# Patient Record
Sex: Female | Born: 1955 | Race: White | Hispanic: No | Marital: Married | Smoking: Never smoker
Health system: Southern US, Community
[De-identification: ages and names within clinical notes are randomized; demographics above are authoritative.]

## PROBLEM LIST (undated history)

## (undated) DIAGNOSIS — T8859XA Other complications of anesthesia, initial encounter: Secondary | ICD-10-CM

## (undated) DIAGNOSIS — K219 Gastro-esophageal reflux disease without esophagitis: Secondary | ICD-10-CM

## (undated) DIAGNOSIS — T4145XA Adverse effect of unspecified anesthetic, initial encounter: Secondary | ICD-10-CM

## (undated) DIAGNOSIS — M1990 Unspecified osteoarthritis, unspecified site: Secondary | ICD-10-CM

## (undated) HISTORY — PX: EYE SURGERY: SHX253

## (undated) HISTORY — PX: TONSILLECTOMY: SUR1361

---

## 1998-08-28 ENCOUNTER — Other Ambulatory Visit: Admission: RE | Admit: 1998-08-28 | Discharge: 1998-08-28 | Payer: Self-pay | Admitting: Obstetrics & Gynecology

## 2000-10-17 ENCOUNTER — Other Ambulatory Visit: Admission: RE | Admit: 2000-10-17 | Discharge: 2000-10-17 | Payer: Self-pay | Admitting: Obstetrics & Gynecology

## 2002-05-12 ENCOUNTER — Other Ambulatory Visit: Admission: RE | Admit: 2002-05-12 | Discharge: 2002-05-12 | Payer: Self-pay | Admitting: Obstetrics & Gynecology

## 2003-08-08 ENCOUNTER — Other Ambulatory Visit: Admission: RE | Admit: 2003-08-08 | Discharge: 2003-08-08 | Payer: Self-pay | Admitting: Obstetrics & Gynecology

## 2004-08-23 ENCOUNTER — Other Ambulatory Visit: Admission: RE | Admit: 2004-08-23 | Discharge: 2004-08-23 | Payer: Self-pay | Admitting: Obstetrics & Gynecology

## 2005-01-03 ENCOUNTER — Emergency Department (HOSPITAL_COMMUNITY): Admission: EM | Admit: 2005-01-03 | Discharge: 2005-01-03 | Payer: Self-pay | Admitting: Emergency Medicine

## 2011-05-23 ENCOUNTER — Other Ambulatory Visit: Payer: Self-pay | Admitting: Obstetrics & Gynecology

## 2011-05-23 DIAGNOSIS — R928 Other abnormal and inconclusive findings on diagnostic imaging of breast: Secondary | ICD-10-CM

## 2011-06-04 ENCOUNTER — Ambulatory Visit
Admission: RE | Admit: 2011-06-04 | Discharge: 2011-06-04 | Disposition: A | Discharge status: Home / Self Care | Payer: BC Managed Care – PPO | Source: Ambulatory Visit | Attending: Obstetrics & Gynecology | Admitting: Obstetrics & Gynecology

## 2011-06-04 DIAGNOSIS — R928 Other abnormal and inconclusive findings on diagnostic imaging of breast: Secondary | ICD-10-CM

## 2011-12-18 ENCOUNTER — Other Ambulatory Visit: Payer: Self-pay | Admitting: Obstetrics & Gynecology

## 2011-12-18 DIAGNOSIS — N6009 Solitary cyst of unspecified breast: Secondary | ICD-10-CM

## 2011-12-23 ENCOUNTER — Ambulatory Visit
Admission: RE | Admit: 2011-12-23 | Discharge: 2011-12-23 | Disposition: A | Discharge status: Home / Self Care | Payer: BC Managed Care – PPO | Source: Ambulatory Visit | Attending: Obstetrics & Gynecology | Admitting: Obstetrics & Gynecology

## 2011-12-23 DIAGNOSIS — N6009 Solitary cyst of unspecified breast: Secondary | ICD-10-CM

## 2013-06-07 ENCOUNTER — Other Ambulatory Visit: Payer: Self-pay

## 2013-06-07 DIAGNOSIS — Z1231 Encounter for screening mammogram for malignant neoplasm of breast: Secondary | ICD-10-CM

## 2013-06-11 ENCOUNTER — Ambulatory Visit
Admission: RE | Admit: 2013-06-11 | Discharge: 2013-06-11 | Disposition: A | Discharge status: Home / Self Care | Payer: BC Managed Care – PPO | Source: Ambulatory Visit

## 2013-06-11 DIAGNOSIS — Z1231 Encounter for screening mammogram for malignant neoplasm of breast: Secondary | ICD-10-CM

## 2016-05-28 ENCOUNTER — Ambulatory Visit (INDEPENDENT_AMBULATORY_CARE_PROVIDER_SITE_OTHER): Payer: Self-pay

## 2016-05-28 ENCOUNTER — Ambulatory Visit (INDEPENDENT_AMBULATORY_CARE_PROVIDER_SITE_OTHER): Payer: BC Managed Care – PPO | Admitting: Orthopedic Surgery

## 2016-05-28 ENCOUNTER — Encounter (INDEPENDENT_AMBULATORY_CARE_PROVIDER_SITE_OTHER): Payer: Self-pay

## 2016-05-28 DIAGNOSIS — M1612 Unilateral primary osteoarthritis, left hip: Secondary | ICD-10-CM | POA: Diagnosis not present

## 2016-05-28 DIAGNOSIS — M25552 Pain in left hip: Secondary | ICD-10-CM | POA: Diagnosis not present

## 2016-05-28 NOTE — Progress Notes (Signed)
   Office Visit Note   Patient: Sabrina Singleton           Date of Birth: 01-14-1956           MRN: [id number] Visit Date: 05/28/2016              Requested by: No referring provider defined for this encounter. PCP: Pcp Not In System   Assessment & Plan: Visit Diagnoses:  1. Unilateral primary osteoarthritis, left hip   2. Pain in left hip     Plan: Follow-up as needed. Discussed that with the hip arthritis she should continue her exercises and discussed that we could consider an intra-articular steroid injection with Dr. Alvester MorinNewton and also discussed long-term options with a total hip replacement. Patient states that her sister just had bilateral total hip replacements. Discussed that if she is ready to proceed with a total hip surgery we would refer her to Dr. Magnus IvanBlackman. Patient states she will hold on surgery at this time and continue with her therapy.  Follow-Up Instructions: Return if symptoms worsen or fail to improve.   Orders:  Orders Placed This Encounter  Procedures  . XR HIP UNILAT W OR W/O PELVIS 2-3 VIEWS LEFT   No orders of the defined types were placed in this encounter.     Procedures: No procedures performed   Clinical Data: No additional findings.   Subjective: Chief Complaint  Patient presents with  . Left Hip - Pain    Patient has a 10 year history of hip pain. 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 She states that physical therapy said she should have an MRI that there is "more than arthritis going on"    Review of Systems   Objective: Vital Signs: There were no vitals taken for this visit.  Physical Exam on examination patient is alert oriented no adenopathy well-dressed normal affect normal respiratory effort she does  have an antalgic gait with abductor lurch on the left which is mild. She has significant decreased range of motion the left hip compared to the right with internal rotation of about 10 external rotation of 30 on the left compared to internal rotation of 40 on the right and external rotation 40 on the right. She has no leg length inequality no shortening on the left compared to the right with her standing.  Ortho Exam  Specialty Comments:  No specialty comments available.  Imaging: Xr Hip Unilat W Or W/o Pelvis 2-3 Views Left  Result Date: 05/28/2016 2 view radiographs of the left hip shows arthritic changes with joint space narrowing and osteophytic bone spurring with no evidence of avascular necrosis no subcondylar cysts.    PMFS History: There are no active problems to display for this patient.  No past medical history on file.  No family history on file.  No past surgical history on file. Social History   Occupational History  . Not on file.   Social History Main Topics  . Smoking status: Not on file  . Smokeless tobacco: Not on file  . Alcohol use Not on file  . Drug use: Unknown  . Sexual activity: Not on file

## 2017-06-11 ENCOUNTER — Telehealth (INDEPENDENT_AMBULATORY_CARE_PROVIDER_SITE_OTHER): Payer: Self-pay | Admitting: Orthopedic Surgery

## 2017-06-11 NOTE — Telephone Encounter (Signed)
Patient states she & Dr Lajoyce Cornersuda had discussed her seeing one of the other physicians for hip replacement. She could not remember which one & wanted Dr Lajoyce Cornersuda to send the referral and give her a call

## 2017-06-11 NOTE — Telephone Encounter (Signed)
I called advised patient per Dr. Audrie Liauda's office notes she needs to be seeing Dr. Magnus IvanBlackman to discuss possible THA. Made appointment for next available with Dr. Magnus IvanBlackman at 06/26/17 at 345pm.

## 2017-06-26 ENCOUNTER — Encounter (INDEPENDENT_AMBULATORY_CARE_PROVIDER_SITE_OTHER): Payer: Self-pay | Admitting: Orthopaedic Surgery

## 2017-06-26 ENCOUNTER — Ambulatory Visit (INDEPENDENT_AMBULATORY_CARE_PROVIDER_SITE_OTHER): Payer: BC Managed Care – PPO | Admitting: Orthopaedic Surgery

## 2017-06-26 ENCOUNTER — Ambulatory Visit (INDEPENDENT_AMBULATORY_CARE_PROVIDER_SITE_OTHER): Payer: BC Managed Care – PPO

## 2017-06-26 DIAGNOSIS — M1612 Unilateral primary osteoarthritis, left hip: Secondary | ICD-10-CM | POA: Diagnosis not present

## 2017-06-26 DIAGNOSIS — M25552 Pain in left hip: Secondary | ICD-10-CM

## 2017-06-26 MED ORDER — TRAMADOL HCL 50 MG PO TABS: 50.0000 mg | ORAL_TABLET | Freq: Four times a day (QID) | ORAL | 0 refills | Status: AC | PRN

## 2017-06-26 MED ORDER — METHOCARBAMOL 500 MG PO TABS: 500.0000 mg | ORAL_TABLET | Freq: Four times a day (QID) | ORAL | 1 refills | Status: DC | PRN

## 2017-06-26 NOTE — Progress Notes (Signed)
Office Visit Note   Patient: Sabrina Singleton           Date of Birth: 03-21-1956           MRN: [id number] Visit Date: 06/26/2017              Requested by: No referring provider defined for this encounter. PCP: System, Pcp Not In   Assessment & Plan: Visit Diagnoses:  1. Pain in left hip   2. Unilateral primary osteoarthritis, left hip     Plan: At this point given the severity of her pain combined with severe arthritis on x-ray findings and given the failure of conservative treatment we are recommending a total hip arthroplasty through direct anterior approach.  I went over her hip model and explained in detail with the surgery involves including a thorough discussion the risk and benefits of surgery and what her intraoperative and postoperative course were involved.  All questions and concerns were answered and addressed.  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  We will work on getting this scheduled.  Follow-Up Instructions: Return for 2 weeks post-op.   Orders:  Orders Placed This Encounter  Procedures  . XR HIP UNILAT W OR W/O PELVIS 1V LEFT   Meds ordered this encounter  Medications  . methocarbamol (ROBAXIN) 500 MG tablet    Sig: Take 1 tablet (500 mg total) by mouth every 6 (six) hours as needed for muscle spasms.    Dispense:  60 tablet    Refill:  1  . traMADol (ULTRAM) 50 MG tablet    Sig: Take 1-2 tablets (50-100 mg total) by mouth every 6 (six) hours as needed.    Dispense:  60 tablet    Refill:  0      Procedures: No procedures performed   Clinical Data: No additional findings.   Subjective: Chief Complaint  Patient presents with  . 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  She first had x-rays in December 2017 work and x-ray her hip today as well.  Her pain is daily  and it hurts with pivoting activities.  It is 10 out of 10.  She is tried and failed all forms of conservative treatment at this point including injections and anti-inflammatories.  She actually has a family hip medical history of arthritis with a sister also is a Horticulturist, commercial who has had bilateral hip replacements.  She has trouble with stiffness in her left hip.  It is detrimentally affecting her activity living, quality of life, and her mobility.  Again conservative treatment has been done for over a year at this point.  She has trouble putting on her shoes and socks on that side as well as pivoting activities cause severe pain in the groin.  She states this is now affecting how she walks and causing back pain.  HPI  Review of Systems She denies any headache, chest pain, shortness of breath, fever, chills, nausea, vomiting.  Objective: Vital Signs: There were no vitals taken for this visit.  Physical Exam She is alert and oriented x3 and in no acute distress Ortho Exam Examination of the right hip is normal.  Examination of her left hip shows severe pain and stiffness with any attempts of internal or external rotation.  I did have her  lay supine and it felt like her leg lengths were near equal. Specialty Comments:  No specialty comments available.  Imaging: Xr Hip Unilat W Or W/o Pelvis 1v Left  Result Date: 06/26/2017 An AP pelvis and lateral of the left hip show severe end-stage arthritis of the left hip.  There is complete loss of the superior lateral joint space.  There is cystic changes in the femoral head and the acetabulum as well as particular osteophytes.  This is worsened when compared to films from December 2017.    PMFS History: Patient Active Problem List   Diagnosis Date Noted  . Pain in left hip 06/26/2017  . Unilateral primary osteoarthritis, left hip 06/26/2017   History reviewed. No pertinent past medical history.  History reviewed. No pertinent family history.  History  reviewed. No pertinent surgical history. Social History   Occupational History  . Not on file  Tobacco Use  . Smoking status: Not on file  Substance and Sexual Activity  . Alcohol use: Not on file  . Drug use: Not on file  . Sexual activity: Not on file

## 2017-09-26 ENCOUNTER — Other Ambulatory Visit (INDEPENDENT_AMBULATORY_CARE_PROVIDER_SITE_OTHER): Payer: Self-pay

## 2017-09-29 NOTE — Progress Notes (Signed)
Need orders in epic. preop on 4/18.

## 2017-09-30 ENCOUNTER — Other Ambulatory Visit (INDEPENDENT_AMBULATORY_CARE_PROVIDER_SITE_OTHER): Payer: Self-pay | Admitting: Physician Assistant

## 2017-09-30 NOTE — Patient Instructions (Addendum)
Sabrina Singleton  09/30/2017   Your procedure is scheduled on: 10-10-17  Report to [facility] Main  Entrance                 Report to admitting at       0530AM    Call this number if you have problems the morning of surgery [phone]    Remember: Do not eat food or drink liquids :After Midnight.     Take these medicines the morning of surgery with A SIP OF WATER: septra                                You may not have any metal on your body including hair pins and              piercings  Do not wear jewelry, make-up, lotions, powders or perfumes, deodorant             Do not wear nail polish.  Do not shave  48 hours prior to surgery.               Do not bring valuables to the hospital. [facility] IS NOT             RESPONSIBLE   FOR VALUABLES.  Contacts, dentures or bridgework may not be worn into surgery.  Leave suitcase in the car. After surgery it may be brought to your room.                 Please read over the following fact sheets you were given: _____________________________________________________________________          [facility] - Preparing for Surgery Before surgery, you can play an important role.  Because skin is not sterile, your skin needs to be as free of germs as possible.  You can reduce the number of germs on your skin by washing with CHG (chlorahexidine gluconate) soap before surgery.  CHG is an antiseptic cleaner which kills germs and bonds with the skin to continue killing germs even after washing. Please DO NOT use if you have an allergy to CHG or antibacterial soaps.  If your skin becomes reddened/irritated stop using the CHG and inform your nurse when you arrive at Short Stay. Do not shave (including legs and underarms) for at least 48 hours prior to the first CHG shower.  You may shave your face/neck. Please follow these instructions carefully:  1.  Shower with CHG Soap the night before surgery and the  morning of  Surgery.  2.  If you choose to wash your hair, wash your hair first as usual with your  normal  shampoo.  3.  After you shampoo, rinse your hair and body thoroughly to remove the  shampoo.                           4.  Use CHG as you would any other liquid soap.  You can apply chg directly  to the skin and wash                       Gently with a scrungie or clean washcloth.  5.  Apply the CHG Soap to your body ONLY FROM THE NECK DOWN.   Do not use on  face/ open                           Wound or open sores. Avoid contact with eyes, ears mouth and genitals (private parts).                       Wash face,  Genitals (private parts) with your normal soap.             6.  Wash thoroughly, paying special attention to the area where your surgery  will be performed.  7.  Thoroughly rinse your body with warm water from the neck down.  8.  DO NOT shower/wash with your normal soap after using and rinsing off  the CHG Soap.                9.  Pat yourself dry with a clean towel.            10.  Wear clean pajamas.            11.  Place clean sheets on your bed the night of your first shower and do not  sleep with pets. Day of Surgery : Do not apply any lotions/deodorants the morning of surgery.  Please wear clean clothes to the hospital/surgery center.  FAILURE TO FOLLOW THESE INSTRUCTIONS MAY RESULT IN THE CANCELLATION OF YOUR SURGERY PATIENT SIGNATURE_________________________________  NURSE SIGNATURE__________________________________  ________________________________________________________________________  WHAT IS A BLOOD TRANSFUSION? Blood Transfusion Information  A transfusion is the replacement of blood or some of its parts. Blood is made up of multiple cells which provide different functions.  Red blood cells carry oxygen and are used for blood loss replacement.  White blood cells fight against infection.  Platelets control bleeding.  Plasma helps clot blood.  Other blood products are  available for specialized needs, such as hemophilia or other clotting disorders. BEFORE THE TRANSFUSION  Who gives blood for transfusions?   Healthy volunteers who are fully evaluated to make sure their blood is safe. This is blood bank blood. Transfusion therapy is the safest it has ever been in the practice of medicine. Before blood is taken from a donor, a complete history is taken to make sure that person has no history of diseases nor engages in risky social behavior (examples are intravenous drug use or sexual activity with multiple partners). The donor's travel history is screened to minimize risk of transmitting infections, such as malaria. The donated blood is tested for signs of infectious diseases, such as HIV and hepatitis. The blood is then tested to be sure it is compatible with you in order to minimize the chance of a transfusion reaction. If you or a relative donates blood, this is often done in anticipation of surgery and is not appropriate for emergency situations. It takes many days to process the donated blood. RISKS AND COMPLICATIONS Although transfusion therapy is very safe and saves many lives, the main dangers of transfusion include:   Getting an infectious disease.  Developing a transfusion reaction. This is an allergic reaction to something in the blood you were given. Every precaution is taken to prevent this. The decision to have a blood transfusion has been considered carefully by your caregiver before blood is given. Blood is not given unless the benefits outweigh the risks. AFTER THE TRANSFUSION  Right after receiving a blood transfusion, you will usually feel much better and more energetic. This is especially true if your red  blood cells have gotten low (anemic). The transfusion raises the level of the red blood cells which carry oxygen, and this usually causes an energy increase.  The nurse administering the transfusion will monitor you carefully for  complications. HOME CARE INSTRUCTIONS  No special instructions are needed after a transfusion. You may find your energy is better. Speak with your caregiver about any limitations on activity for underlying diseases you may have. SEEK MEDICAL CARE IF:   Your condition is not improving after your transfusion.  You develop redness or irritation at the intravenous (IV) site. SEEK IMMEDIATE MEDICAL CARE IF:  Any of the following symptoms occur over the next 12 hours:  Shaking chills.  You have a temperature by mouth above 102 F (38.9 C), not controlled by medicine.  Chest, back, or muscle pain.  People around you feel you are not acting correctly or are confused.  Shortness of breath or difficulty breathing.  Dizziness and fainting.  You get a rash or develop hives.  You have a decrease in urine output.  Your urine turns a dark color or changes to pink, red, or brown. Any of the following symptoms occur over the next 10 days:  You have a temperature by mouth above 102 F (38.9 C), not controlled by medicine.  Shortness of breath.  Weakness after normal activity.  The white part of the eye turns yellow (jaundice).  You have a decrease in the amount of urine or are urinating less often.  Your urine turns a dark color or changes to pink, red, or brown. Document Released: 05/31/2000 Document Revised: 08/26/2011 Document Reviewed: 01/18/2008 Exit[employer] Patient Information 2014 [place], [place].  _______________________________________________________________________  Incentive Spirometer  An incentive spirometer is a tool that can help keep your lungs clear and active. This tool measures how well you are filling your lungs with each breath. Taking long deep breaths may help reverse or decrease the chance of developing breathing (pulmonary) problems (especially infection) following:  A long period of time when you are unable to move or be active. BEFORE THE PROCEDURE   If  the spirometer includes an indicator to show your best effort, your nurse or respiratory therapist will set it to a desired goal.  If possible, sit up straight or lean slightly forward. Try not to slouch.  Hold the incentive spirometer in an upright position. INSTRUCTIONS FOR USE  1. Sit on the edge of your bed if possible, or sit up as far as you can in bed or on a chair. 2. Hold the incentive spirometer in an upright position. 3. Breathe out normally. 4. Place the mouthpiece in your mouth and seal your lips tightly around it. 5. Breathe in slowly and as deeply as possible, raising the piston or the ball toward the top of the column. 6. Hold your breath for 3-5 seconds or for as long as possible. Allow the piston or ball to fall to the bottom of the column. 7. Remove the mouthpiece from your mouth and breathe out normally. 8. Rest for a few seconds and repeat Steps 1 through 7 at least 10 times every 1-2 hours when you are awake. Take your time and take a few normal breaths between deep breaths. 9. The spirometer may include an indicator to show your best effort. Use the indicator as a goal to work toward during each repetition. 10. After each set of 10 deep breaths, practice coughing to be sure your lungs are clear. If you have an incision (the cut  made at the time of surgery), support your incision when coughing by placing a pillow or rolled up towels firmly against it. Once you are able to get out of bed, walk around indoors and cough well. You may stop using the incentive spirometer when instructed by your caregiver.  RISKS AND COMPLICATIONS  Take your time so you do not get dizzy or light-headed.  If you are in pain, you may need to take or ask for pain medication before doing incentive spirometry. It is harder to take a deep breath if you are having pain. AFTER USE  Rest and breathe slowly and easily.  It can be helpful to keep track of a log of your progress. Your caregiver can  provide you with a simple table to help with this. If you are using the spirometer at home, follow these instructions: SEEK MEDICAL CARE IF:   You are having difficultly using the spirometer.  You have trouble using the spirometer as often as instructed.  Your pain medication is not giving enough relief while using the spirometer.  You develop fever of 100.5 F (38.1 C) or higher. SEEK IMMEDIATE MEDICAL CARE IF:   You cough up bloody sputum that had not been present before.  You develop fever of 102 F (38.9 C) or greater.  You develop worsening pain at or near the incision site. MAKE SURE YOU:   Understand these instructions.  Will watch your condition.  Will get help right away if you are not doing well or get worse. Document Released: 10/14/2006 Document Revised: 08/26/2011 Document Reviewed: 12/15/2006 [facility] Patient Information 2014 [place], [place].   ________________________________________________________________________

## 2017-10-02 ENCOUNTER — Encounter (HOSPITAL_COMMUNITY): Payer: Self-pay

## 2017-10-02 ENCOUNTER — Encounter (HOSPITAL_COMMUNITY)
Admission: RE | Admit: 2017-10-02 | Discharge: 2017-10-02 | Disposition: A | Discharge status: Home / Self Care | Payer: BC Managed Care – PPO | Source: Ambulatory Visit | Attending: Orthopaedic Surgery | Admitting: Orthopaedic Surgery

## 2017-10-02 ENCOUNTER — Other Ambulatory Visit: Payer: Self-pay

## 2017-10-02 DIAGNOSIS — Z01810 Encounter for preprocedural cardiovascular examination: Secondary | ICD-10-CM | POA: Insufficient documentation

## 2017-10-02 DIAGNOSIS — M1612 Unilateral primary osteoarthritis, left hip: Secondary | ICD-10-CM | POA: Insufficient documentation

## 2017-10-02 DIAGNOSIS — Z01812 Encounter for preprocedural laboratory examination: Secondary | ICD-10-CM | POA: Diagnosis present

## 2017-10-02 HISTORY — DX: Other complications of anesthesia, initial encounter: T88.59XA

## 2017-10-02 HISTORY — DX: Adverse effect of unspecified anesthetic, initial encounter: T41.45XA

## 2017-10-02 HISTORY — DX: Gastro-esophageal reflux disease without esophagitis: K21.9

## 2017-10-02 HISTORY — DX: Unspecified osteoarthritis, unspecified site: M19.90

## 2017-10-02 LAB — CBC
HCT: 36.1 % (ref 36.0–46.0)
HEMOGLOBIN: 12 g/dL (ref 12.0–15.0)
MCH: 30.5 pg (ref 26.0–34.0)
MCHC: 33.2 g/dL (ref 30.0–36.0)
MCV: 91.9 fL (ref 78.0–100.0)
PLATELETS: 264 10*3/uL (ref 150–400)
RBC: 3.93 MIL/uL (ref 3.87–5.11)
RDW: 13.2 % (ref 11.5–15.5)
WBC: 3.7 10*3/uL — AB (ref 4.0–10.5)

## 2017-10-02 LAB — ABO/RH: ABO/RH(D): A POS

## 2017-10-02 LAB — SURGICAL PCR SCREEN
MRSA, PCR: NEGATIVE
Staphylococcus aureus: NEGATIVE

## 2017-10-09 ENCOUNTER — Encounter (HOSPITAL_COMMUNITY): Payer: Self-pay | Admitting: Anesthesiology

## 2017-10-09 MED ORDER — TRANEXAMIC ACID 1000 MG/10ML IV SOLN
1000.0000 mg | INTRAVENOUS | Status: AC
  Administered 2017-10-10: 1000 mg via INTRAVENOUS
  Filled 2017-10-09: qty 1100

## 2017-10-09 NOTE — Anesthesia Preprocedure Evaluation (Addendum)
Anesthesia Evaluation  Patient identified by MRN, date of birth, ID band Patient awake    Reviewed: Allergy & Precautions, NPO status , Patient's Chart, lab work & pertinent test results  History of Anesthesia Complications (+) history of anesthetic complications  Airway Mallampati: II  TM Distance: >3 FB Neck ROM: Full    Dental no notable dental hx. (+) Teeth Intact   Pulmonary neg pulmonary ROS,    Pulmonary exam normal breath sounds clear to auscultation       Cardiovascular Normal cardiovascular exam Rhythm:Regular Rate:Normal     Neuro/Psych negative neurological ROS  negative psych ROS   GI/Hepatic Neg liver ROS, GERD  ,  Endo/Other  negative endocrine ROS  Renal/GU negative Renal ROS  negative genitourinary   Musculoskeletal  (+) Arthritis , Osteoarthritis,  OA left hip   Abdominal   Peds  Hematology negative hematology ROS (+)   Anesthesia Other Findings   Reproductive/Obstetrics                            Anesthesia Physical Anesthesia Plan  ASA: II  Anesthesia Plan: Spinal   Post-op Pain Management:    Induction:   PONV Risk Score and Plan: 3 and Treatment may vary due to age or medical condition, Ondansetron, Dexamethasone, Midazolam and Scopolamine patch - Pre-op  Airway Management Planned: Natural Airway, Nasal Cannula and Simple Face Mask  Additional Equipment:   Intra-op Plan:   Post-operative Plan: Extubation in OR  Informed Consent: I have reviewed the patients History and Physical, chart, labs and discussed the procedure including the risks, benefits and alternatives for the proposed anesthesia with the patient or authorized representative who has indicated his/her understanding and acceptance.   Dental advisory given  Plan Discussed with: CRNA, Anesthesiologist and Surgeon  Anesthesia Plan Comments:        Anesthesia Quick Evaluation

## 2017-10-10 ENCOUNTER — Inpatient Hospital Stay (HOSPITAL_COMMUNITY): Payer: BC Managed Care – PPO | Admitting: Certified Registered Nurse Anesthetist

## 2017-10-10 ENCOUNTER — Inpatient Hospital Stay (HOSPITAL_COMMUNITY): Payer: BC Managed Care – PPO

## 2017-10-10 ENCOUNTER — Inpatient Hospital Stay (HOSPITAL_COMMUNITY)
Admission: RE | Admit: 2017-10-10 | Discharge: 2017-10-12 | DRG: 470 | Disposition: A | Discharge status: Home Health | Payer: BC Managed Care – PPO | Source: Ambulatory Visit | Attending: Orthopaedic Surgery | Admitting: Orthopaedic Surgery

## 2017-10-10 ENCOUNTER — Encounter (HOSPITAL_COMMUNITY)
Admission: RE | Disposition: A | Discharge status: Home Health | Payer: Self-pay | Source: Ambulatory Visit | Attending: Orthopaedic Surgery

## 2017-10-10 ENCOUNTER — Other Ambulatory Visit: Payer: Self-pay

## 2017-10-10 ENCOUNTER — Encounter (HOSPITAL_COMMUNITY): Payer: Self-pay | Admitting: *Deleted

## 2017-10-10 DIAGNOSIS — Z885 Allergy status to narcotic agent status: Secondary | ICD-10-CM | POA: Diagnosis not present

## 2017-10-10 DIAGNOSIS — Z419 Encounter for procedure for purposes other than remedying health state, unspecified: Secondary | ICD-10-CM

## 2017-10-10 DIAGNOSIS — Z96642 Presence of left artificial hip joint: Secondary | ICD-10-CM

## 2017-10-10 DIAGNOSIS — M1612 Unilateral primary osteoarthritis, left hip: Principal | ICD-10-CM | POA: Diagnosis present

## 2017-10-10 HISTORY — PX: TOTAL HIP ARTHROPLASTY: SHX124

## 2017-10-10 LAB — TYPE AND SCREEN
ABO/RH(D): A POS
Antibody Screen: NEGATIVE

## 2017-10-10 SURGERY — ARTHROPLASTY, HIP, TOTAL, ANTERIOR APPROACH
Anesthesia: Spinal | Site: Hip | Laterality: Left

## 2017-10-10 MED ORDER — OXYCODONE HCL 5 MG PO TABS
10.0000 mg | ORAL_TABLET | ORAL | Status: DC | PRN
  Administered 2017-10-11 – 2017-10-12 (×2): 10 mg via ORAL
  Filled 2017-10-10: qty 2

## 2017-10-10 MED ORDER — SCOPOLAMINE 1 MG/3DAYS TD PT72
MEDICATED_PATCH | TRANSDERMAL | Status: DC | PRN
  Administered 2017-10-10: 1 via TRANSDERMAL

## 2017-10-10 MED ORDER — METOCLOPRAMIDE HCL 5 MG/ML IJ SOLN: 10.0000 mg | Freq: Once | INTRAMUSCULAR | Status: DC | PRN

## 2017-10-10 MED ORDER — ONDANSETRON HCL 4 MG/2ML IJ SOLN
INTRAMUSCULAR | Status: AC
  Filled 2017-10-10: qty 2

## 2017-10-10 MED ORDER — ACETAMINOPHEN 325 MG PO TABS
325.0000 mg | ORAL_TABLET | Freq: Four times a day (QID) | ORAL | Status: DC | PRN
  Administered 2017-10-11 (×2): 650 mg via ORAL
  Filled 2017-10-10 (×2): qty 2

## 2017-10-10 MED ORDER — LACTATED RINGERS IV SOLN
INTRAVENOUS | Status: DC
  Administered 2017-10-10 (×3): via INTRAVENOUS

## 2017-10-10 MED ORDER — ASPIRIN 81 MG PO CHEW
81.0000 mg | CHEWABLE_TABLET | Freq: Two times a day (BID) | ORAL | Status: DC
  Administered 2017-10-10 – 2017-10-12 (×4): 81 mg via ORAL
  Filled 2017-10-10 (×4): qty 1

## 2017-10-10 MED ORDER — ONDANSETRON HCL 4 MG/2ML IJ SOLN: 4.0000 mg | Freq: Four times a day (QID) | INTRAMUSCULAR | Status: DC | PRN

## 2017-10-10 MED ORDER — SODIUM CHLORIDE 0.9 % IV SOLN
INTRAVENOUS | Status: DC
  Administered 2017-10-10: 11:00:00 via INTRAVENOUS

## 2017-10-10 MED ORDER — ONDANSETRON HCL 4 MG/2ML IJ SOLN
INTRAMUSCULAR | Status: DC | PRN
  Administered 2017-10-10: 4 mg via INTRAVENOUS

## 2017-10-10 MED ORDER — METHOCARBAMOL 1000 MG/10ML IJ SOLN
500.0000 mg | Freq: Four times a day (QID) | INTRAVENOUS | Status: DC | PRN
  Administered 2017-10-10: 500 mg via INTRAVENOUS
  Filled 2017-10-10: qty 550

## 2017-10-10 MED ORDER — DEXAMETHASONE SODIUM PHOSPHATE 10 MG/ML IJ SOLN
INTRAMUSCULAR | Status: AC
  Filled 2017-10-10: qty 1

## 2017-10-10 MED ORDER — FENTANYL CITRATE (PF) 100 MCG/2ML IJ SOLN
INTRAMUSCULAR | Status: AC
  Filled 2017-10-10: qty 2

## 2017-10-10 MED ORDER — ONDANSETRON HCL 4 MG PO TABS
4.0000 mg | ORAL_TABLET | Freq: Four times a day (QID) | ORAL | Status: DC | PRN
  Administered 2017-10-10: 4 mg via ORAL
  Filled 2017-10-10: qty 1

## 2017-10-10 MED ORDER — DOCUSATE SODIUM 100 MG PO CAPS
100.0000 mg | ORAL_CAPSULE | Freq: Two times a day (BID) | ORAL | Status: DC
  Administered 2017-10-10 – 2017-10-12 (×5): 100 mg via ORAL
  Filled 2017-10-10 (×5): qty 1

## 2017-10-10 MED ORDER — HYDROMORPHONE HCL 1 MG/ML IJ SOLN: 0.2500 mg | INTRAMUSCULAR | Status: DC | PRN

## 2017-10-10 MED ORDER — SODIUM CHLORIDE 0.9 % IR SOLN
Status: DC | PRN
  Administered 2017-10-10: 1

## 2017-10-10 MED ORDER — METOCLOPRAMIDE HCL 5 MG/ML IJ SOLN: 5.0000 mg | Freq: Three times a day (TID) | INTRAMUSCULAR | Status: DC | PRN

## 2017-10-10 MED ORDER — SCOPOLAMINE 1 MG/3DAYS TD PT72
MEDICATED_PATCH | TRANSDERMAL | Status: AC
  Filled 2017-10-10: qty 1

## 2017-10-10 MED ORDER — FENTANYL CITRATE (PF) 100 MCG/2ML IJ SOLN
INTRAMUSCULAR | Status: DC | PRN
  Administered 2017-10-10 (×2): 50 ug via INTRAVENOUS

## 2017-10-10 MED ORDER — PHENOL 1.4 % MT LIQD: 1.0000 | OROMUCOSAL | Status: DC | PRN

## 2017-10-10 MED ORDER — METHOCARBAMOL 500 MG PO TABS
500.0000 mg | ORAL_TABLET | Freq: Four times a day (QID) | ORAL | Status: DC | PRN
  Administered 2017-10-10 – 2017-10-12 (×6): 500 mg via ORAL
  Filled 2017-10-10 (×6): qty 1

## 2017-10-10 MED ORDER — OXYCODONE HCL 5 MG PO TABS
5.0000 mg | ORAL_TABLET | ORAL | Status: DC | PRN
  Administered 2017-10-10 (×2): 10 mg via ORAL
  Administered 2017-10-10 – 2017-10-11 (×5): 5 mg via ORAL
  Administered 2017-10-12: 10 mg via ORAL
  Filled 2017-10-10 (×2): qty 2
  Filled 2017-10-10 (×4): qty 1
  Filled 2017-10-10: qty 2
  Filled 2017-10-10 (×3): qty 1
  Filled 2017-10-10: qty 2

## 2017-10-10 MED ORDER — PHENYLEPHRINE 40 MCG/ML (10ML) SYRINGE FOR IV PUSH (FOR BLOOD PRESSURE SUPPORT)
PREFILLED_SYRINGE | INTRAVENOUS | Status: AC
  Filled 2017-10-10: qty 10

## 2017-10-10 MED ORDER — PROPOFOL 10 MG/ML IV BOLUS
INTRAVENOUS | Status: DC | PRN
  Administered 2017-10-10 (×2): 10 mg via INTRAVENOUS
  Administered 2017-10-10: 20 mg via INTRAVENOUS
  Administered 2017-10-10 (×2): 10 mg via INTRAVENOUS
  Administered 2017-10-10: 20 mg via INTRAVENOUS

## 2017-10-10 MED ORDER — MENTHOL 3 MG MT LOZG: 1.0000 | LOZENGE | OROMUCOSAL | Status: DC | PRN

## 2017-10-10 MED ORDER — DIPHENHYDRAMINE HCL 12.5 MG/5ML PO ELIX: 12.5000 mg | ORAL_SOLUTION | ORAL | Status: DC | PRN

## 2017-10-10 MED ORDER — MIDAZOLAM HCL 2 MG/2ML IJ SOLN
INTRAMUSCULAR | Status: AC
  Filled 2017-10-10: qty 2

## 2017-10-10 MED ORDER — PANTOPRAZOLE SODIUM 40 MG PO TBEC
40.0000 mg | DELAYED_RELEASE_TABLET | Freq: Every day | ORAL | Status: DC
  Administered 2017-10-10 – 2017-10-12 (×3): 40 mg via ORAL
  Filled 2017-10-10 (×3): qty 1

## 2017-10-10 MED ORDER — TRAMADOL HCL 50 MG PO TABS
100.0000 mg | ORAL_TABLET | Freq: Four times a day (QID) | ORAL | Status: DC | PRN
  Administered 2017-10-11: 100 mg via ORAL
  Filled 2017-10-10 (×2): qty 2

## 2017-10-10 MED ORDER — POLYETHYLENE GLYCOL 3350 17 G PO PACK: 17.0000 g | PACK | Freq: Every day | ORAL | Status: DC | PRN

## 2017-10-10 MED ORDER — METOCLOPRAMIDE HCL 5 MG PO TABS: 5.0000 mg | ORAL_TABLET | Freq: Three times a day (TID) | ORAL | Status: DC | PRN

## 2017-10-10 MED ORDER — PROPOFOL 500 MG/50ML IV EMUL
INTRAVENOUS | Status: DC | PRN
  Administered 2017-10-10: 75 ug/kg/min via INTRAVENOUS

## 2017-10-10 MED ORDER — CHLORHEXIDINE GLUCONATE 4 % EX LIQD: 60.0000 mL | Freq: Once | CUTANEOUS | Status: DC

## 2017-10-10 MED ORDER — DEXAMETHASONE SODIUM PHOSPHATE 10 MG/ML IJ SOLN
INTRAMUSCULAR | Status: DC | PRN
  Administered 2017-10-10: 10 mg via INTRAVENOUS

## 2017-10-10 MED ORDER — HYDROMORPHONE HCL 1 MG/ML IJ SOLN: 0.5000 mg | INTRAMUSCULAR | Status: DC | PRN

## 2017-10-10 MED ORDER — BUPIVACAINE IN DEXTROSE 0.75-8.25 % IT SOLN
INTRATHECAL | Status: DC | PRN
  Administered 2017-10-10: 1.8 mL via INTRATHECAL

## 2017-10-10 MED ORDER — MEPERIDINE HCL 50 MG/ML IJ SOLN: 6.2500 mg | INTRAMUSCULAR | Status: DC | PRN

## 2017-10-10 MED ORDER — MIDAZOLAM HCL 5 MG/5ML IJ SOLN
INTRAMUSCULAR | Status: DC | PRN
  Administered 2017-10-10: 2 mg via INTRAVENOUS

## 2017-10-10 MED ORDER — ALUM & MAG HYDROXIDE-SIMETH 200-200-20 MG/5ML PO SUSP: 30.0000 mL | ORAL | Status: DC | PRN

## 2017-10-10 MED ORDER — EPHEDRINE 5 MG/ML INJ
INTRAVENOUS | Status: AC
  Filled 2017-10-10: qty 10

## 2017-10-10 MED ORDER — CEFAZOLIN SODIUM-DEXTROSE 1-4 GM/50ML-% IV SOLN
1.0000 g | Freq: Four times a day (QID) | INTRAVENOUS | Status: AC
  Administered 2017-10-10 (×2): 1 g via INTRAVENOUS
  Filled 2017-10-10 (×2): qty 50

## 2017-10-10 MED ORDER — PHENYLEPHRINE 40 MCG/ML (10ML) SYRINGE FOR IV PUSH (FOR BLOOD PRESSURE SUPPORT)
PREFILLED_SYRINGE | INTRAVENOUS | Status: DC | PRN
  Administered 2017-10-10: 80 ug via INTRAVENOUS
  Administered 2017-10-10: 40 ug via INTRAVENOUS
  Administered 2017-10-10: 80 ug via INTRAVENOUS
  Administered 2017-10-10 (×3): 40 ug via INTRAVENOUS
  Administered 2017-10-10: 80 ug via INTRAVENOUS

## 2017-10-10 MED ORDER — PROPOFOL 10 MG/ML IV BOLUS
INTRAVENOUS | Status: AC
  Filled 2017-10-10: qty 60

## 2017-10-10 MED ORDER — EPHEDRINE SULFATE-NACL 50-0.9 MG/10ML-% IV SOSY
PREFILLED_SYRINGE | INTRAVENOUS | Status: DC | PRN
  Administered 2017-10-10 (×2): 5 mg via INTRAVENOUS
  Administered 2017-10-10: 10 mg via INTRAVENOUS

## 2017-10-10 MED ORDER — CEFAZOLIN SODIUM-DEXTROSE 2-4 GM/100ML-% IV SOLN
2.0000 g | INTRAVENOUS | Status: AC
  Administered 2017-10-10: 2 g via INTRAVENOUS
  Filled 2017-10-10: qty 100

## 2017-10-10 SURGICAL SUPPLY — 35 items
BAG ZIPLOCK 12X15 (MISCELLANEOUS) IMPLANT
BENZOIN TINCTURE PRP APPL 2/3 (GAUZE/BANDAGES/DRESSINGS) IMPLANT
BLADE SAW SGTL 18X1.27X75 (BLADE) ×2 IMPLANT
BLADE SAW SGTL 18X1.27X75MM (BLADE) ×1
CAPT HIP TOTAL 2 ×3 IMPLANT
CLOSURE WOUND 1/2 X4 (GAUZE/BANDAGES/DRESSINGS) ×1
COVER PERINEAL POST (MISCELLANEOUS) ×3 IMPLANT
COVER SURGICAL LIGHT HANDLE (MISCELLANEOUS) ×3 IMPLANT
DRAPE STERI IOBAN 125X83 (DRAPES) ×3 IMPLANT
DRAPE U-SHAPE 47X51 STRL (DRAPES) ×6 IMPLANT
DRESSING AQUACEL AG SP 3.5X10 (GAUZE/BANDAGES/DRESSINGS) ×1 IMPLANT
DRSG AQUACEL AG ADV 3.5X10 (GAUZE/BANDAGES/DRESSINGS) ×3 IMPLANT
DRSG AQUACEL AG SP 3.5X10 (GAUZE/BANDAGES/DRESSINGS) ×3
DURAPREP 26ML APPLICATOR (WOUND CARE) ×3 IMPLANT
ELECT REM PT RETURN 15FT ADLT (MISCELLANEOUS) ×3 IMPLANT
GAUZE XEROFORM 1X8 LF (GAUZE/BANDAGES/DRESSINGS) ×6 IMPLANT
GLOVE BIO SURGEON STRL SZ7.5 (GLOVE) ×3 IMPLANT
GLOVE BIOGEL PI IND STRL 8 (GLOVE) ×2 IMPLANT
GLOVE BIOGEL PI INDICATOR 8 (GLOVE) ×4
GLOVE ECLIPSE 8.0 STRL XLNG CF (GLOVE) ×3 IMPLANT
GOWN STRL REUS W/TWL XL LVL3 (GOWN DISPOSABLE) ×6 IMPLANT
HANDPIECE INTERPULSE COAX TIP (DISPOSABLE) ×2
HOLDER FOLEY CATH W/STRAP (MISCELLANEOUS) ×3 IMPLANT
PACK ANTERIOR HIP CUSTOM (KITS) ×3 IMPLANT
SET HNDPC FAN SPRY TIP SCT (DISPOSABLE) ×1 IMPLANT
STAPLER VISISTAT 35W (STAPLE) IMPLANT
STRIP CLOSURE SKIN 1/2X4 (GAUZE/BANDAGES/DRESSINGS) ×2 IMPLANT
SUT ETHIBOND NAB CT1 #1 30IN (SUTURE) ×3 IMPLANT
SUT MNCRL AB 4-0 PS2 18 (SUTURE) IMPLANT
SUT VIC AB 0 CT1 36 (SUTURE) ×3 IMPLANT
SUT VIC AB 1 CT1 36 (SUTURE) ×3 IMPLANT
SUT VIC AB 2-0 CT1 27 (SUTURE) ×4
SUT VIC AB 2-0 CT1 TAPERPNT 27 (SUTURE) ×2 IMPLANT
TRAY FOLEY W/METER SILVER 16FR (SET/KITS/TRAYS/PACK) ×3 IMPLANT
YANKAUER SUCT BULB TIP 10FT TU (MISCELLANEOUS) ×3 IMPLANT

## 2017-10-10 NOTE — Brief Op Note (Signed)
10/10/2017  8:36 AM  PATIENT:  Ivonne Andreweresa H Hailes  62 y.o. female  PRE-OPERATIVE DIAGNOSIS:  osteoarthritis left hip  POST-OPERATIVE DIAGNOSIS:  osteoarthritis left hip  PROCEDURE:  Procedure(s): LEFT TOTAL HIP ARTHROPLASTY ANTERIOR APPROACH (Left)  SURGEON:  Surgeon(s) and Role:    Kathryne Hitch* Elmar Antigua Y, MD - Primary  PHYSICIAN ASSISTANT: Rexene EdisonGil Clark, PA-C assisted  ANESTHESIA:   spinal  EBL:  200 mL   COUNTS:  YES  DICTATION: .Other Dictation: Dictation Number [id number]  PLAN OF CARE: Admit to inpatient   PATIENT DISPOSITION:  PACU - hemodynamically stable.   Delay start of Pharmacological VTE agent (>24hrs) due to surgical blood loss or risk of bleeding: no

## 2017-10-10 NOTE — Anesthesia Procedure Notes (Signed)
Spinal  Patient location during procedure: OR Start time: 10/10/2017 7:20 AM End time: 10/10/2017 7:23 AM Staffing Anesthesiologist: Mal AmabileFoster, Michael, MD Resident/CRNA: Epimenio SarinJarvela, Laquon Emel R, CRNA Performed: resident/CRNA  Preanesthetic Checklist Completed: patient identified, surgical consent, pre-op evaluation, timeout performed, IV checked, risks and benefits discussed and monitors and equipment checked Spinal Block Patient position: sitting Prep: ChloraPrep Patient monitoring: heart rate, cardiac monitor, continuous pulse ox and blood pressure Approach: midline Location: L3-4 Injection technique: single-shot Needle Needle type: Pencan  Needle gauge: 24 G Needle length: 10 cm Needle insertion depth: 7 cm Assessment Sensory level: T6

## 2017-10-10 NOTE — Transfer of Care (Signed)
Immediate Anesthesia Transfer of Care Note  Patient: Sabrina Singleton  Procedure(s) Performed: LEFT TOTAL HIP ARTHROPLASTY ANTERIOR APPROACH (Left Hip)  Patient Location: PACU  Anesthesia Type:Spinal  Level of Consciousness: drowsy and patient cooperative  Airway & Oxygen Therapy: Patient Spontanous Breathing and Patient connected to face mask oxygen  Post-op Assessment: Report given to RN and Post -op Vital signs reviewed and stable  Post vital signs: Reviewed and stable  Last Vitals:  Vitals Value Taken Time  BP 112/61 10/10/2017  8:51 AM  Temp    Pulse 102 10/10/2017  8:54 AM  Resp 9 10/10/2017  8:54 AM  SpO2 100 % 10/10/2017  8:54 AM  Vitals shown include unvalidated device data.  Last Pain:  Vitals:   10/10/17 0615  TempSrc:   PainSc: 1       Patients Stated Pain Goal: 4 (10/10/17 0615)  Complications: No apparent anesthesia complications

## 2017-10-10 NOTE — Evaluation (Signed)
Physical Therapy Evaluation Patient Details Name: Sabrina Singleton MRN: [id number] DOB: 08/25/1955 Today's Date: 10/10/2017   History of Present Illness  Pt s/p L THR   Clinical Impression  Pt s/p L THR and presents with decreased L LE strength/ROM and post op pain limiting functional mobility.  Pt should progress well to dc home with family assist.    Follow Up Recommendations Home health PT    Equipment Recommendations  None recommended by PT    Recommendations for Other Services OT consult     Precautions / Restrictions Precautions Precautions: Fall Restrictions Weight Bearing Restrictions: No Other Position/Activity Restrictions: WBAT      Mobility  Bed Mobility Overal bed mobility: Needs Assistance Bed Mobility: Supine to Sit     Supine to sit: Min assist     General bed mobility comments: cues for sequence and use of R LE to self assist  Transfers Overall transfer level: Needs assistance   Transfers: Sit to/from Stand Sit to Stand: Min assist         General transfer comment: cues for LE management and use of UEs to self assist  Ambulation/Gait Ambulation/Gait assistance: Min assist Ambulation Distance (Feet): 10 Feet Assistive device: Rolling walker (2 wheeled) Gait Pattern/deviations: Step-to pattern;Decreased step length - right;Decreased step length - left;Shuffle;Trunk flexed Gait velocity: decr   General Gait Details: cues for posture, position from RW and sequence  Stairs            Wheelchair Mobility    Modified Rankin (Stroke Patients Only)       Balance                                             Pertinent Vitals/Pain Pain Assessment: 0-10 Pain Score: 5  Pain Location: L hip Pain Descriptors / Indicators: Aching;Sore Pain Intervention(s): Limited activity within patient's tolerance;Monitored during session;Premedicated before session;Ice applied    Home Living Family/patient expects to be discharged  to:: Private residence Living Arrangements: Spouse/significant other Available Help at Discharge: Family Type of Home: House Home Access: Stairs to enter Entrance Stairs-Rails: Lawyer of Steps: 4+1 Home Layout: Two level;Able to live on main level with bedroom/bathroom Home Equipment: Dan Humphreys - 2 wheels;Cane - single point      Prior Function Level of Independence: Independent               Hand Dominance        Extremity/Trunk Assessment   Upper Extremity Assessment Upper Extremity Assessment: Overall WFL for tasks assessed    Lower Extremity Assessment Lower Extremity Assessment: LLE deficits/detail LLE Deficits / Details: Strength at hip 2/5 with AAROM to 75 flex and 15 abd    Cervical / Trunk Assessment Cervical / Trunk Assessment: Normal  Communication   Communication: No difficulties  Cognition Arousal/Alertness: Awake/alert Behavior During Therapy: WFL for tasks assessed/performed Overall Cognitive Status: Within Functional Limits for tasks assessed                                        General Comments      Exercises Total Joint Exercises Ankle Circles/Pumps: AROM;Both;15 reps;Supine Heel Slides: Both;AROM;15 reps;Supine Hip ABduction/ADduction: AAROM;Left;Supine;10 reps   Assessment/Plan    PT Assessment Patient needs continued PT services  PT Problem List Decreased  strength;Decreased range of motion;Decreased activity tolerance;Decreased mobility;Decreased knowledge of use of DME;Pain       PT Treatment Interventions DME instruction;Gait training;Stair training;Functional mobility training;Therapeutic activities;Therapeutic exercise;Patient/family education    PT Goals (Current goals can be found in the Care Plan section)  Acute Rehab PT Goals Patient Stated Goal: Regain IND PT Goal Formulation: With patient Time For Goal Achievement: 10/17/17 Potential to Achieve Goals: Good    Frequency  7X/week   Barriers to discharge        Co-evaluation               AM-PAC PT "6 Clicks" Daily Activity  Outcome Measure Difficulty turning over in bed (including adjusting bedclothes, sheets and blankets)?: Unable   Difficulty sitting down on and standing up from a chair with arms (e.g., wheelchair, bedside commode, etc,.)?: Unable Help needed moving to and from a bed to chair (including a wheelchair)?: A Little Help needed walking in hospital room?: A Lot Help needed climbing 3-5 steps with a railing? : A Lot 6 Click Score: 9    End of Session Equipment Utilized During Treatment: Gait belt Activity Tolerance: Patient tolerated treatment well Patient left: in chair;with call bell/phone within reach;with family/visitor present Nurse Communication: Mobility status PT Visit Diagnosis: Difficulty in walking, not elsewhere classified (R26.2)    Time: 1429-1500 PT Time Calculation (min) (ACUTE ONLY): 31 min   Charges:   PT Evaluation $PT Eval Low Complexity: 1 Low PT Treatments $Gait Training: 8-22 mins   PT G Codes:        Pg [phone]   Ozie Dimaria 10/10/2017, 5:01 PM

## 2017-10-10 NOTE — H&P (Signed)
TOTAL HIP ADMISSION H&P  Patient is admitted for left total hip arthroplasty.  Subjective:  Chief Complaint: left hip pain  HPI: Sabrina Singleton, 62 y.o. female, has a history of pain and functional disability in the left hip(s) due to arthritis and patient has failed non-surgical conservative treatments for greater than 12 weeks to include NSAID's and/or analgesics, corticosteriod injections, flexibility and strengthening excercises, weight reduction as appropriate and activity modification.  Onset of symptoms was gradual starting 3 years ago with gradually worsening course since that time.The patient noted no past surgery on the left hip(s).  Patient currently rates pain in the left hip at 10 out of 10 with activity. Patient has night pain, worsening of pain with activity and weight bearing, pain that interfers with activities of daily living and pain with passive range of motion. Patient has evidence of subchondral cysts, subchondral sclerosis, periarticular osteophytes and joint space narrowing by imaging studies. This condition presents safety issues increasing the risk of falls.  There is no current active infection.  Patient Active Problem List   Diagnosis Date Noted  . Pain in left hip 06/26/2017  . Unilateral primary osteoarthritis, left hip 06/26/2017   Past Medical History:  Diagnosis Date  . Arthritis   . Complication of anesthesia    as a very young child eye surgery  atropine  was given with anesthesia and pt. stopped breathing   . GERD (gastroesophageal reflux disease)    hx of 15 years ago no meds changed diet    Past Surgical History:  Procedure Laterality Date  . CESAREAN SECTION    . EYE SURGERY     as a child  . TONSILLECTOMY      Current Facility-Administered Medications  Medication Dose Route Frequency Provider Last Rate Last Dose  . ceFAZolin (ANCEF) IVPB 2g/100 mL premix  2 g Intravenous On Call to OR Kirtland Bouchardlark, Gilbert W, PA-C      . chlorhexidine (HIBICLENS) 4 %  liquid 4 application  60 mL Topical Once Kirtland Bouchardlark, Gilbert W, PA-C      . lactated ringers infusion   Intravenous Continuous Mal AmabileFoster, Michael, MD 20 mL/hr at 10/10/17 [phone]    . tranexamic acid (CYKLOKAPRON) 1,000 mg in sodium chloride 0.9 % 100 mL IVPB  1,000 mg Intravenous To OR Kathryne HitchBlackman, Kenzee Bassin Y, MD       Facility-Administered Medications Ordered in Other Encounters  Medication Dose Route Frequency Provider Last Rate Last Dose  . scopolamine (TRANSDERM-SCOP) 1 MG/3DAYS    Anesthesia Intra-op Epimenio SarinJarvela, Joshua R, CRNA   1 patch at 10/10/17 [phone]   Allergies  Allergen Reactions  . Codeine Nausea And Vomiting    Social History   Tobacco Use  . Smoking status: Never Smoker  . Smokeless tobacco: Never Used  Substance Use Topics  . Alcohol use: Yes    Comment: twice a week    History reviewed. No pertinent family history.   Review of Systems  Musculoskeletal: Positive for joint pain.  All other systems reviewed and are negative.   Objective:  Physical Exam  Constitutional: She is oriented to person, place, and time. She appears well-developed and well-nourished.  HENT:  Head: Normocephalic and atraumatic.  Eyes: Pupils are equal, round, and reactive to light. EOM are normal.  Neck: Normal range of motion. Neck supple.  Cardiovascular: Normal rate and regular rhythm.  Respiratory: Effort normal and breath sounds normal.  GI: Soft. Bowel sounds are normal.  Musculoskeletal:       Left hip:  She exhibits decreased range of motion, decreased strength, tenderness and bony tenderness.  Neurological: She is alert and oriented to person, place, and time.  Skin: Skin is warm and dry.  Psychiatric: She has a normal mood and affect.    Vital signs in last 24 hours: Temp:  [99.2 F (37.3 C)] 99.2 F (37.3 C) (04/26 0540) Pulse Rate:  [80] 80 (04/26 0540) Resp:  [18] 18 (04/26 0540) BP: (126)/(76) 126/76 (04/26 0540) SpO2:  [100 %] 100 % (04/26 0540) Weight:  [148 lb (67.1 kg)] 148  lb (67.1 kg) (04/26 0615)  Labs:   Estimated body mass index is 24.63 kg/m as calculated from the following:   Height as of this encounter: 5\' 5"  (1.651 m).   Weight as of this encounter: 148 lb (67.1 kg).   Imaging Review Plain radiographs demonstrate severe degenerative joint disease of the left hip(s). The bone quality appears to be excellent for age and reported activity level.    Preoperative templating of the joint replacement has been completed, documented, and submitted to the Operating Room personnel in order to optimize intra-operative equipment management.   Anticipated LOS equal to or greater than 2 midnights due to - Age 24 and older with one or more of the following:  - Obesity  - Expected need for hospital services (PT, OT, Nursing) required for safe  discharge  - Anticipated need for postoperative skilled nursing care or inpatient rehab  -  OR   - Unanticipated findings during/Post Surgery: Slow post-op progression: GI, pain control, mobility  - Patient is a high risk of re-admission due to: None     Assessment/Plan:  End stage arthritis, left hip(s)  The patient history, physical examination, clinical judgement of the provider and imaging studies are consistent with end stage degenerative joint disease of the left hip(s) and total hip arthroplasty is deemed medically necessary. The treatment options including medical management, injection therapy, arthroscopy and arthroplasty were discussed at length. The risks and benefits of total hip arthroplasty were presented and reviewed. The risks due to aseptic loosening, infection, stiffness, dislocation/subluxation,  thromboembolic complications and other imponderables were discussed.  The patient acknowledged the explanation, agreed to proceed with the plan and consent was signed. Patient is being admitted for inpatient treatment for surgery, pain control, PT, OT, prophylactic antibiotics, VTE prophylaxis, progressive  ambulation and ADL's and discharge planning.The patient is planning to be discharged home with home health services

## 2017-10-10 NOTE — Anesthesia Postprocedure Evaluation (Signed)
Anesthesia Post Note  Patient: Sabrina Singleton  Procedure(s) Performed: LEFT TOTAL HIP ARTHROPLASTY ANTERIOR APPROACH (Left Hip)     Patient location during evaluation: PACU Anesthesia Type: Spinal Level of consciousness: oriented and awake and alert Pain management: pain level controlled Vital Signs Assessment: post-procedure vital signs reviewed and stable Respiratory status: spontaneous breathing, respiratory function stable, patient connected to nasal cannula oxygen and nonlabored ventilation Cardiovascular status: blood pressure returned to baseline and stable Postop Assessment: no headache, no backache, no apparent nausea or vomiting, spinal receding and patient able to bend at knees Anesthetic complications: no    Last Vitals:  Vitals:   10/10/17 0930 10/10/17 0945  BP: 101/60   Pulse: 67   Resp: (!) 8   Temp:  (!) 36 C  SpO2: 100%     Last Pain:  Vitals:   10/10/17 0945  TempSrc:   PainSc: 0-No pain                 Bryston Colocho A.

## 2017-10-11 LAB — CBC
HCT: 30.3 % — ABNORMAL LOW (ref 36.0–46.0)
HEMOGLOBIN: 9.8 g/dL — AB (ref 12.0–15.0)
MCH: 30.2 pg (ref 26.0–34.0)
MCHC: 32.3 g/dL (ref 30.0–36.0)
MCV: 93.2 fL (ref 78.0–100.0)
Platelets: 217 10*3/uL (ref 150–400)
RBC: 3.25 MIL/uL — ABNORMAL LOW (ref 3.87–5.11)
RDW: 13.3 % (ref 11.5–15.5)
WBC: 9.9 10*3/uL (ref 4.0–10.5)

## 2017-10-11 LAB — BASIC METABOLIC PANEL
ANION GAP: 10 (ref 5–15)
BUN: 13 mg/dL (ref 6–20)
CHLORIDE: 104 mmol/L (ref 101–111)
CO2: 25 mmol/L (ref 22–32)
Calcium: 9.2 mg/dL (ref 8.9–10.3)
Creatinine, Ser: 0.74 mg/dL (ref 0.44–1.00)
GFR calc Af Amer: >60 mL/min (ref >60)
GFR calc non Af Amer: >60 mL/min (ref >60)
GLUCOSE: 130 mg/dL — AB (ref 65–99)
POTASSIUM: 4.2 mmol/L (ref 3.5–5.1)
Sodium: 139 mmol/L (ref 135–145)

## 2017-10-11 MED ORDER — METHOCARBAMOL 500 MG PO TABS: 500.0000 mg | ORAL_TABLET | Freq: Four times a day (QID) | ORAL | 1 refills | Status: AC | PRN

## 2017-10-11 MED ORDER — ASPIRIN 81 MG PO CHEW: 81.0000 mg | CHEWABLE_TABLET | Freq: Two times a day (BID) | ORAL | 0 refills | Status: AC

## 2017-10-11 MED ORDER — OXYCODONE HCL 5 MG PO TABS: 5.0000 mg | ORAL_TABLET | ORAL | 0 refills | Status: DC | PRN

## 2017-10-11 NOTE — Progress Notes (Signed)
Physical Therapy Treatment Patient Details Name: Sabrina Singleton MRN: [id number] DOB: 1956-02-06 Today's Date: 10/11/2017    History of Present Illness Pt s/p L THR     PT Comments    POD # 1 am session Assisted OOB having pt use a belt to self assist L LE off bed.  Assisted with amb an increased distance with no c/o dizziness.  Returned to room and performed some THR TE's following HEP handout.  Instructed on proper tech, freq as well as use of ICE.   Follow Up Recommendations  Home health PT     Equipment Recommendations  None recommended by PT    Recommendations for Other Services       Precautions / Restrictions Precautions Precautions: Fall Restrictions Weight Bearing Restrictions: No Other Position/Activity Restrictions: WBAT    Mobility  Bed Mobility Overal bed mobility: Needs Assistance Bed Mobility: Supine to Sit     Supine to sit: Supervision;Min guard     General bed mobility comments: demonstarted and instructed pt how to use a belt to self assist L LE off bed  Transfers Overall transfer level: Needs assistance Equipment used: Rolling walker (2 wheeled) Transfers: Sit to/from Stand Sit to Stand: Min guard;Min assist         General transfer comment: cues for LE management and use of UEs to self assist safety with turns  Ambulation/Gait Ambulation/Gait assistance: Min guard Ambulation Distance (Feet): 38 Feet Assistive device: Rolling walker (2 wheeled) Gait Pattern/deviations: Step-to pattern;Decreased step length - right;Decreased step length - left;Shuffle;Trunk flexed Gait velocity: decr   General Gait Details: cues for posture, position from RW and sequence   Stairs             Wheelchair Mobility    Modified Rankin (Stroke Patients Only)       Balance                                            Cognition Arousal/Alertness: Awake/alert Behavior During Therapy: WFL for tasks assessed/performed Overall  Cognitive Status: Within Functional Limits for tasks assessed                                        Exercises   Total Hip Replacement TE's 10 reps ankle pumps 10 reps knee presses 10 reps heel slides 10 reps SAQ's 10 reps ABD Followed by ICE    General Comments        Pertinent Vitals/Pain Pain Assessment: 0-10 Pain Score: 4  Pain Location: L hip Pain Descriptors / Indicators: Aching;Sore Pain Intervention(s): Monitored during session;Repositioned;Premedicated before session;Ice applied    Home Living                      Prior Function            PT Goals (current goals can now be found in the care plan section) Progress towards PT goals: Progressing toward goals    Frequency    7X/week      PT Plan Current plan remains appropriate    Co-evaluation              AM-PAC PT "6 Clicks" Daily Activity  Outcome Measure  Difficulty turning over in bed (including adjusting bedclothes, sheets and blankets)?: A Lot  Difficulty moving from lying on back to sitting on the side of the bed? : A Lot Difficulty sitting down on and standing up from a chair with arms (e.g., wheelchair, bedside commode, etc,.)?: A Lot Help needed moving to and from a bed to chair (including a wheelchair)?: A Lot Help needed walking in hospital room?: A Lot Help needed climbing 3-5 steps with a railing? : Total 6 Click Score: 11    End of Session Equipment Utilized During Treatment: Gait belt Activity Tolerance: Patient tolerated treatment well Patient left: in chair;with call bell/phone within reach;with family/visitor present Nurse Communication: Mobility status PT Visit Diagnosis: Difficulty in walking, not elsewhere classified (R26.2)     Time: 1610-9604 PT Time Calculation (min) (ACUTE ONLY): 25 min  Charges:  $Gait Training: 8-22 mins $Therapeutic Exercise: 8-22 mins                    G Codes:       Felecia Shelling  PTA [facility]  Acute  Rehab Pager       [phone]

## 2017-10-11 NOTE — Plan of Care (Signed)
Plan of care discussed.   

## 2017-10-11 NOTE — Discharge Instructions (Signed)

## 2017-10-11 NOTE — Progress Notes (Signed)
Physical Therapy Treatment Patient Details Name: Sabrina Singleton MRN: [id number] DOB: [birth year] Today's Date: 10/11/2017    History of Present Illness Pt s/p L THR     PT Comments    POD # 1 pm session Assisted to bathroom, assisted with amb a greater distance in hallway, assisted with stair training, then assisted back to bed per pt request to rest.  Pt plans to D/C to home tomorrow.   Follow Up Recommendations  Home health PT     Equipment Recommendations  3in1 (PT)(told Higher education careers adviser)    Recommendations for Other Services       Precautions / Restrictions Precautions Precautions: Fall Restrictions Weight Bearing Restrictions: No Other Position/Activity Restrictions: WBAT    Mobility  Bed Mobility Overal bed mobility: Needs Assistance Bed Mobility: Sit to Supine       Sit to supine: Min guard;Min assist   General bed mobility comments: demonstarted and instructed pt how to use a belt to self assist L LE onto bed  Transfers Overall transfer level: Needs assistance Equipment used: Rolling walker (2 wheeled) Transfers: Sit to/from Stand Sit to Stand: Min guard;Min assist         General transfer comment: cues for LE management and use of UEs to self assist safety with turns  Ambulation/Gait Ambulation/Gait assistance: Min guard Ambulation Distance (Feet): 55 Feet Assistive device: Rolling walker (2 wheeled) Gait Pattern/deviations: Step-to pattern;Decreased step length - right;Decreased step length - left;Shuffle;Trunk flexed Gait velocity: decr   General Gait Details: cues for posture, position from RW and sequence   Stairs Stairs: Yes Stairs assistance: Min guard;Min assist Stair Management: Two rails;Step to pattern;Forwards Number of Stairs: 2 General stair comments: 50% VC's on proper tech and sequencing   Wheelchair Mobility    Modified Rankin (Stroke Patients Only)       Balance                                             Cognition Arousal/Alertness: Awake/alert Behavior During Therapy: WFL for tasks assessed/performed Overall Cognitive Status: Within Functional Limits for tasks assessed                                        Exercises      General Comments        Pertinent Vitals/Pain Pain Assessment: 0-10 Pain Score: 7  Pain Location: L hip Pain Descriptors / Indicators: Aching;Sore;Operative site guarding Pain Intervention(s): Monitored during session;Repositioned;Ice applied;Premedicated before session    Home Living                      Prior Function            PT Goals (current goals can now be found in the care plan section) Progress towards PT goals: Progressing toward goals    Frequency    7X/week      PT Plan Current plan remains appropriate    Co-evaluation              AM-PAC PT "6 Clicks" Daily Activity  Outcome Measure  Difficulty turning over in bed (including adjusting bedclothes, sheets and blankets)?: A Lot Difficulty moving from lying on back to sitting on the side of the bed? : A Lot Difficulty sitting down on  and standing up from a chair with arms (e.g., wheelchair, bedside commode, etc,.)?: A Lot Help needed moving to and from a bed to chair (including a wheelchair)?: A Lot Help needed walking in hospital room?: A Lot Help needed climbing 3-5 steps with a railing? : A Lot 6 Click Score: 12    End of Session Equipment Utilized During Treatment: Gait belt Activity Tolerance: Patient tolerated treatment well Patient left: in bed;with call bell/phone within reach Nurse Communication: Mobility status PT Visit Diagnosis: Difficulty in walking, not elsewhere classified (R26.2)     Time: 1330-1410 PT Time Calculation (min) (ACUTE ONLY): 40 min  Charges:  $Gait Training: 23-37 mins $Therapeutic Activity: 8-22 mins                    G Codes:       Felecia Shelling  PTA [facility]  Acute  Rehab Pager      [phone]

## 2017-10-11 NOTE — Progress Notes (Signed)
   Subjective: 1 Day Post-Op Procedure(s) (LRB): LEFT TOTAL HIP ARTHROPLASTY ANTERIOR APPROACH (Left) Patient reports pain as mild and moderate.    Objective: Vital signs in last 24 hours: Temp:  [97.7 F (36.5 C)-98.8 F (37.1 C)] 98.8 F (37.1 C) (04/27 1014) Pulse Rate:  [59-71] 63 (04/27 1014) Resp:  [15-20] 20 (04/27 1014) BP: (91-107)/(51-67) 98/59 (04/27 1014) SpO2:  [97 %-100 %] 99 % (04/27 1014)  Intake/Output from previous day: 04/26 0701 - 04/27 0700 In: 5160 [P.O.:1020; I.V.:4035; IV Piggyback:105] Out: 4325 [Urine:4125; Blood:200] Intake/Output this shift: Total I/O In: 327.5 [P.O.:240; I.V.:87.5] Out: -   Recent Labs    10/11/17 0522  HGB 9.8*   Recent Labs    10/11/17 0522  WBC 9.9  RBC 3.25*  HCT 30.3*  PLT 217   Recent Labs    10/11/17 0522  NA 139  K 4.2  CL 104  CO2 25  BUN 13  CREATININE 0.74  GLUCOSE 130*  CALCIUM 9.2   No results for input(s): LABPT, INR in the last 72 hours.  Neurologically intact No results found.  Assessment/Plan: 1 Day Post-Op Procedure(s) (LRB): LEFT TOTAL HIP ARTHROPLASTY ANTERIOR APPROACH (Left) Up with therapy possible home Sunday  Eldred Manges 10/11/2017, 1:12 PM

## 2017-10-12 LAB — CBC
HCT: 27.2 % — ABNORMAL LOW (ref 36.0–46.0)
Hemoglobin: 9.1 g/dL — ABNORMAL LOW (ref 12.0–15.0)
MCH: 31.5 pg (ref 26.0–34.0)
MCHC: 33.5 g/dL (ref 30.0–36.0)
MCV: 94.1 fL (ref 78.0–100.0)
PLATELETS: 193 10*3/uL (ref 150–400)
RBC: 2.89 MIL/uL — ABNORMAL LOW (ref 3.87–5.11)
RDW: 13.7 % (ref 11.5–15.5)
WBC: 8.3 10*3/uL (ref 4.0–10.5)

## 2017-10-12 NOTE — Care Management Note (Signed)
Case Management Note  Patient Details  Name: Sabrina Singleton MRN: [id number] Date of Birth: 1955/07/16  Subjective/Objective:       S/p L THR             Action/Plan: NCM spoke to pt and husband at bedside. Pt has RW at home. Requesting 3n1 bedside commode for home. Contacted [facility] for DME to be delivered to room prior to dc. Offered choice for HH/list. Pt agreeable to [facility] for [facility].   Expected Discharge Date:  10/12/17               Expected Discharge Plan:  Home w Home Health Services  In-House Referral:  NA  Discharge planning Services  CM Consult  Post Acute Care Choice:  Home Health Choice offered to:  Patient  DME Arranged:  3-N-1 DME Agency:  [facility]  HH Arranged:  PT HH Agency:  [facility]  Status of Service:  Completed, signed off  If discussed at [employer] of Stay Meetings, dates discussed:    Additional Comments:  Elliot Cousin, RN 10/12/2017, 10:54 AM

## 2017-10-12 NOTE — Progress Notes (Signed)
   Subjective: 2 Days Post-Op Procedure(s) (LRB): LEFT TOTAL HIP ARTHROPLASTY ANTERIOR APPROACH (Left) Patient reports pain as mild and moderate.    Objective: Vital signs in last 24 hours: Temp:  [98.1 F (36.7 C)-99.1 F (37.3 C)] 99.1 F (37.3 C) (04/28 0615) Pulse Rate:  [63-72] 72 (04/28 0615) Resp:  [13-20] 18 (04/28 0615) BP: (91-102)/(49-72) 99/49 (04/28 0615) SpO2:  [97 %-100 %] 97 % (04/28 0615)  Intake/Output from previous day: 04/27 0701 - 04/28 0700 In: 687.5 [P.O.:600; I.V.:87.5] Out: 1350 [Urine:1350] Intake/Output this shift: Total I/O In: 360 [P.O.:360] Out: -   Recent Labs    10/11/17 0522 10/12/17 0526  HGB 9.8* 9.1*   Recent Labs    10/11/17 0522 10/12/17 0526  WBC 9.9 8.3  RBC 3.25* 2.89*  HCT 30.3* 27.2*  PLT 217 193   Recent Labs    10/11/17 0522  NA 139  K 4.2  CL 104  CO2 25  BUN 13  CREATININE 0.74  GLUCOSE 130*  CALCIUM 9.2   No results for input(s): LABPT, INR in the last 72 hours.  Neurologically intact No results found.  Assessment/Plan: 2 Days Post-Op Procedure(s) (LRB): LEFT TOTAL HIP ARTHROPLASTY ANTERIOR APPROACH (Left) Up with therapy, discharge after therapy.   Eldred Manges 10/12/2017, 9:54 AM

## 2017-10-12 NOTE — Progress Notes (Signed)
Physical Therapy Treatment Patient Details Name: Sabrina Singleton MRN: [id number] DOB: 19-Dec-1955 Today's Date: 10/12/2017    History of Present Illness Pt s/p L THR     PT Comments    Patient progressing with mobility and spouse demonstrating appropriate level of assistance on stairs.  More simulated as her steps have wide railing.  Feel stable for d/c home with follow up HHPT.    Follow Up Recommendations  Home health PT     Equipment Recommendations  3in1 (PT)    Recommendations for Other Services       Precautions / Restrictions Precautions Precautions: Fall Restrictions Weight Bearing Restrictions: No Other Position/Activity Restrictions: WBAT    Mobility  Bed Mobility Overal bed mobility: Needs Assistance Bed Mobility: Supine to Sit     Supine to sit: Min assist     General bed mobility comments: up in chair  Transfers Overall transfer level: Needs assistance Equipment used: Rolling walker (2 wheeled) Transfers: Sit to/from Stand Sit to Stand: Supervision         General transfer comment: increased time, use of UE support  Ambulation/Gait Ambulation/Gait assistance: Supervision Ambulation Distance (Feet): 80 Feet Assistive device: Rolling walker (2 wheeled) Gait Pattern/deviations: Step-through pattern;Decreased stride length     General Gait Details: increased time for mobility   Stairs Stairs: Yes Stairs assistance: Min assist Stair Management: Step to pattern;Forwards;One rail Right Number of Stairs: 3 General stair comments: with spouse assist on L side after demonsration   Wheelchair Mobility    Modified Rankin (Stroke Patients Only)       Balance Overall balance assessment: Needs assistance   Sitting balance-Leahy Scale: Good       Standing balance-Leahy Scale: Fair                              Cognition Arousal/Alertness: Awake/alert Behavior During Therapy: WFL for tasks assessed/performed Overall  Cognitive Status: Within Functional Limits for tasks assessed                                        Exercises Total Joint Exercises Quad Sets: AROM;5 reps;Seated Short Arc Quad: AROM;5 reps;Seated Heel Slides: AAROM;10 reps;Seated(educated how to use sheet to assist) Hip ABduction/ADduction: AAROM;5 reps;Seated(with sheet)    General Comments General comments (skin integrity, edema, etc.): educated on options for car transfers      Pertinent Vitals/Pain Pain Assessment: 0-10 Pain Score: 6  Pain Location: L hip Pain Descriptors / Indicators: Aching Pain Intervention(s): Monitored during session    Home Living Family/patient expects to be discharged to:: Private residence Living Arrangements: Spouse/significant other Available Help at Discharge: Family Type of Home: House Home Access: Stairs to enter Entrance Stairs-Rails: Left;Right Home Layout: Two level;Able to live on main level with bedroom/bathroom Home Equipment: Dan Humphreys - 2 wheels;Cane - single point      Prior Function Level of Independence: Independent          PT Goals (current goals can now be found in the care plan section) Acute Rehab PT Goals Patient Stated Goal: return to independence Progress towards PT goals: Progressing toward goals    Frequency    7X/week      PT Plan Current plan remains appropriate    Co-evaluation              AM-PAC PT "6 Clicks" Daily  Activity  Outcome Measure  Difficulty turning over in bed (including adjusting bedclothes, sheets and blankets)?: A Lot Difficulty moving from lying on back to sitting on the side of the bed? : A Lot Difficulty sitting down on and standing up from a chair with arms (e.g., wheelchair, bedside commode, etc,.)?: A Little Help needed moving to and from a bed to chair (including a wheelchair)?: A Little Help needed walking in hospital room?: A Little Help needed climbing 3-5 steps with a railing? : A Little 6 Click  Score: 16    End of Session Equipment Utilized During Treatment: Gait belt Activity Tolerance: Patient tolerated treatment well Patient left: in chair;with call bell/phone within reach;with family/visitor present         Time: 1610-9604 PT Time Calculation (min) (ACUTE ONLY): 30 min  Charges:  $Gait Training: 8-22 mins $Therapeutic Exercise: 8-22 mins                    G CodesSheran Lawless, [place] [phone] 10/12/2017    Elray Mcgregor 10/12/2017, 1:10 PM

## 2017-10-12 NOTE — Evaluation (Signed)
Occupational Therapy Evaluation Patient Details Name: Sabrina Singleton MRN: [id number] DOB: 08-08-1955 Today's Date: 10/12/2017    History of Present Illness Pt s/p L THR    Clinical Impression   Pt doing well. Overall at min guard assist level for functional transfer to 3in1 with walker. Educated on LB dressing with AE options. Pt has good family support available. Will continue to follow if here after today but per chart, plan is to potentially d/c home today. Ok to d/c today from OT standpoint.     Follow Up Recommendations  No OT follow up;Supervision - Intermittent    Equipment Recommendations  3 in 1 bedside commode    Recommendations for Other Services       Precautions / Restrictions Precautions Precautions: Fall Restrictions Weight Bearing Restrictions: No Other Position/Activity Restrictions: WBAT      Mobility Bed Mobility Overal bed mobility: Needs Assistance Bed Mobility: Supine to Sit     Supine to sit: Min assist     General bed mobility comments: min assist for L LE over to EOB  Transfers Overall transfer level: Needs assistance Equipment used: Rolling walker (2 wheeled) Transfers: Sit to/from Stand Sit to Stand: Min guard         General transfer comment: min cues for hand placement and safety.     Balance                                           ADL either performed or assessed with clinical judgement   ADL Overall ADL's : Needs assistance/impaired Eating/Feeding: Independent;Sitting   Grooming: Wash/dry hands;Set up;Sitting   Upper Body Bathing: Set up;Sitting   Lower Body Bathing: Minimal assistance;Sit to/from stand   Upper Body Dressing : Set up;Sitting   Lower Body Dressing: Moderate assistance;Sit to/from stand   Toilet Transfer: Min guard;BSC;RW   Toileting- Architect and Hygiene: Min guard;Sit to/from stand         General ADL Comments: Pt states her tub is upstairs and she has a half  bath downstairs so she will sponge bathe initially. Educated pt on AE options and pt practiced with sock aid to don R sock and only needed min cues. She verbalizes understanding of where to obtain AE if desired and states she also has her sister there to assist. Educated on sequence for LB dressing also.      Vision Patient Visual Report: No change from baseline       Perception     Praxis      Pertinent Vitals/Pain Pain Assessment: 0-10 Pain Score: 8  Pain Location: L hip Pain Descriptors / Indicators: Aching Pain Intervention(s): Monitored during session;Ice applied     Hand Dominance     Extremity/Trunk Assessment Upper Extremity Assessment Upper Extremity Assessment: Overall WFL for tasks assessed           Communication Communication Communication: No difficulties   Cognition Arousal/Alertness: Awake/alert Behavior During Therapy: WFL for tasks assessed/performed Overall Cognitive Status: Within Functional Limits for tasks assessed                                     General Comments       Exercises     Shoulder Instructions      Home Living Family/patient expects to be discharged to::  Private residence Living Arrangements: Spouse/significant other Available Help at Discharge: Family Type of Home: House Home Access: Stairs to enter Secretary/administrator of Steps: 4+1 Entrance Stairs-Rails: Left;Right Home Layout: Two level;Able to live on main level with bedroom/bathroom Alternate Level Stairs-Number of Steps: 15 Alternate Level Stairs-Rails: Right Bathroom Shower/Tub: Chief Strategy Officer: Standard     Home Equipment: Environmental consultant - 2 wheels;Cane - single point          Prior Functioning/Environment Level of Independence: Independent                 OT Problem List: Decreased strength;Decreased knowledge of use of DME or AE      OT Treatment/Interventions: Self-care/ADL training;DME and/or AE  instruction;Therapeutic activities;Patient/family education    OT Goals(Current goals can be found in the care plan section) Acute Rehab OT Goals Patient Stated Goal: return to independence OT Goal Formulation: With patient Time For Goal Achievement: 10/19/17 Potential to Achieve Goals: Good  OT Frequency: Min 2X/week   Barriers to D/C:            Co-evaluation              AM-PAC PT "6 Clicks" Daily Activity     Outcome Measure Help from another person eating meals?: None Help from another person taking care of personal grooming?: A Little Help from another person toileting, which includes using toliet, bedpan, or urinal?: A Little Help from another person bathing (including washing, rinsing, drying)?: A Little Help from another person to put on and taking off regular upper body clothing?: None Help from another person to put on and taking off regular lower body clothing?: A Little 6 Click Score: 20   End of Session Equipment Utilized During Treatment: Rolling walker  Activity Tolerance: Patient tolerated treatment well Patient left: in chair;with call bell/phone within reach  OT Visit Diagnosis: Muscle weakness (generalized) (M62.81)                Time: 9604-5409 OT Time Calculation (min): 35 min Charges:  OT General Charges $OT Visit: 1 Visit OT Evaluation $OT Eval Low Complexity: 1 Low OT Treatments $Therapeutic Activity: 8-22 mins G-Codes:       [name] [name] 10/12/2017, 10:22 AM

## 2017-10-12 NOTE — Discharge Summary (Signed)
Patient ID: Sabrina Singleton MRN: [id number] DOB/AGE: 60-28-1957 60 y.o.  Admit date: 10/10/2017 Discharge date: 10/12/2017  Admission Diagnoses:  Principal Problem:   Unilateral primary osteoarthritis, left hip Active Problems:   Status post total replacement of left hip   Discharge Diagnoses:  Same  Past Medical History:  Diagnosis Date  . Arthritis   . Complication of anesthesia    as a very young child eye surgery  atropine  was given with anesthesia and pt. stopped breathing   . GERD (gastroesophageal reflux disease)    hx of 15 years ago no meds changed diet    Surgeries: Procedure(s): LEFT TOTAL HIP ARTHROPLASTY ANTERIOR APPROACH on 10/10/2017   Consultants:   Discharged Condition: Improved  Hospital Course: MAEGEN WIGLE is an 62 y.o. female who was admitted 10/10/2017 for operative treatment ofUnilateral primary osteoarthritis, left hip. Patient has severe unremitting pain that affects sleep, daily activities, and work/hobbies. After pre-op clearance the patient was taken to the operating room on 10/10/2017 and underwent  Procedure(s): LEFT TOTAL HIP ARTHROPLASTY ANTERIOR APPROACH.    Patient was given perioperative antibiotics:  Anti-infectives (From admission, onward)   Start     Dose/Rate Route Frequency Ordered Stop   10/10/17 1330  ceFAZolin (ANCEF) IVPB 1 g/50 mL premix     1 g 100 mL/hr over 30 Minutes Intravenous Every 6 hours 10/10/17 1024 10/10/17 1915   10/10/17 0610  ceFAZolin (ANCEF) IVPB 2g/100 mL premix     2 g 200 mL/hr over 30 Minutes Intravenous On call to O.R. 10/10/17 4098 10/10/17 0734       Patient was given sequential compression devices, early ambulation, and chemoprophylaxis to prevent DVT.  Patient benefited maximally from hospital stay and there were no complications.    Recent vital signs:  Patient Vitals for the past 24 hrs:  BP Temp Temp src Pulse Resp SpO2  10/12/17 0920 (!) 102/54 - - 70 - -  10/12/17 0615 (!) 99/49 99.1 F  (37.3 C) Axillary 72 18 97 %  10/11/17 1954 102/72 98.1 F (36.7 C) Oral 69 13 100 %     Recent laboratory studies:  Recent Labs    10/11/17 0522 10/12/17 0526  WBC 9.9 8.3  HGB 9.8* 9.1*  HCT 30.3* 27.2*  PLT 217 193  NA 139  --   K 4.2  --   CL 104  --   CO2 25  --   BUN 13  --   CREATININE 0.74  --   GLUCOSE 130*  --   CALCIUM 9.2  --      Discharge Medications:   Allergies as of 10/12/2017      Reactions   Codeine Nausea And Vomiting      Medication List    TAKE these medications   aspirin 81 MG chewable tablet Chew 1 tablet (81 mg total) by mouth 2 (two) times daily.   DEEP BLUE RELIEF EX Apply 1 application topically daily.   diphenhydramine-acetaminophen 25-500 MG Tabs tablet Commonly known as:  TYLENOL PM Take 1 tablet by mouth at bedtime as needed.   methocarbamol 500 MG tablet Commonly known as:  ROBAXIN Take 1 tablet (500 mg total) by mouth every 6 (six) hours as needed for muscle spasms.   oxyCODONE 5 MG immediate release tablet Commonly known as:  Oxy IR/ROXICODONE Take 1-2 tablets (5-10 mg total) by mouth every 4 (four) hours as needed for moderate pain (pain score 4-6).   sulfamethoxazole-trimethoprim 800-160 MG tablet Commonly  known as:  BACTRIM DS,SEPTRA DS Take 1 tablet by mouth 2 (two) times daily. For 7 days   traMADol 50 MG tablet Commonly known as:  ULTRAM Take 1-2 tablets (50-100 mg total) by mouth every 6 (six) hours as needed.   TYLENOL 8 HOUR ARTHRITIS PAIN 650 MG CR tablet Generic drug:  acetaminophen Take 1,300 mg by mouth 2 (two) times daily.       Diagnostic Studies: Dg Pelvis Portable  Result Date: 10/10/2017 CLINICAL DATA:  Status post left hip replacement. EXAM: PORTABLE PELVIS 1-2 VIEWS COMPARISON:  Intraoperative fluoroscopic images earlier today. Hip radiographs 06/26/2017. FINDINGS: Sequelae of left total hip arthroplasty are identified. The prosthetic femoral and acetabular components appear normally located on  this single projection. Postoperative gas is noted in the surrounding soft tissues. No acute fracture is identified. IMPRESSION: Left total hip arthroplasty without evidence of acute complication. Electronically Signed   By: Sebastian Ache M.D.   On: 10/10/2017 09:25   Dg C-arm 1-60 Min-no Report  Result Date: 10/10/2017 Fluoroscopy was utilized by the requesting physician.  No radiographic interpretation.   Dg Hip Operative Unilat With Pelvis Left  Result Date: 10/10/2017 CLINICAL DATA:  Status post left hip replacement. EXAM: OPERATIVE LEFT HIP (WITH PELVIS IF PERFORMED) TECHNIQUE: Fluoroscopic spot image(s) were submitted for interpretation post-operatively. COMPARISON:  Hip radiographs 06/26/2017 FINDINGS: Six intraoperative fluoroscopic spot images are provided and demonstrate performance of a left total hip arthroplasty with the prosthetic components normally located on these limited images. IMPRESSION: Intraoperative images during left total hip arthroplasty. Electronically Signed   By: Sebastian Ache M.D.   On: 10/10/2017 09:26    Disposition:  to home    Follow-up Information    Kathryne Hitch, MD Follow up in 2 week(s).   Specialty:  Orthopedic Surgery Contact information: [address] [place] [address] [phone]        [facility] up.   Specialty:  Home Health Services Why:  Home Health Physical Therapy- agency will call to arrange inital visit Contact information: [address] [place] [address] [phone]            Signed: Kathryne Hitch 10/12/2017, 7:20 PM

## 2017-10-13 NOTE — Op Note (Signed)
NAME:  Sabrina Singleton, Sabrina Singleton                    ACCOUNT NO.:  MEDICAL RECORD NO.:  [id number]  LOCATION:                                 FACILITY:  PHYSICIAN:  Vanita Panda. Magnus Ivan, M.D.DATE OF BIRTH:  DATE OF PROCEDURE:  10/10/2017 DATE OF DISCHARGE:                              OPERATIVE REPORT   PREOPERATIVE DIAGNOSIS:  Primary osteoarthritis and degenerative joint disease, left hip.  POSTOPERATIVE DIAGNOSIS:  Primary osteoarthritis and degenerative joint disease, left hip.  PROCEDURE:  Left total hip arthroplasty through direct anterior approach.  IMPLANTS:  DePuy Sector Gription acetabular component size 50, size 36 +0 polyethylene liner, size 13 Corail femoral component with standard offset, size 36 +1.5 ceramic hip ball.  SURGEON:  Vanita Panda. Magnus Ivan, M.D.  ASSISTANT:  Richardean Canal, PA-C.  ANESTHESIA:  Spinal.  ANTIBIOTICS:  2 g of IV Ancef.  BLOOD LOSS:  200 cc.  COMPLICATIONS:  None.  INDICATIONS:  The patient is a very pleasant 62 year old female with known debilitating osteoarthritis involving her left hip.  Her pain is daily, and at this point, it has detrimentally affected her activities of daily living, her quality of life and her mobility.  X-rays do show complete loss of the superolateral joint space as well as sclerotic and cystic changes in the femoral head.  At this point, she does wish to proceed with a total hip arthroplasty given the failure of conservative treatment.  She understands the risk of acute blood loss anemia, nerve and vessel injury, fracture, infection, dislocation, DVT.  She understands her goals are to decrease pain, improve mobility, and overall improved quality of life.  PROCEDURE DESCRIPTION:  After informed consent was obtained, an appropriate left hip was marked, she was brought to the operating room where spinal anesthesia was obtained while she was on her stretcher. She was laid in a supine position on the stretcher.  A  Foley catheter was placed and the both feet had traction boots applied to them.  She was then placed supine on the Hana fracture table with the perineal post in place and both legs in inline skeletal traction devices, but no traction applied.  Her left operative hip was prepped and draped with DuraPrep and sterile drapes.  A time-out was called and she was identified as correct patient and correct left hip.  We then made an incision just inferior and posterior to the anterior superior iliac spine and carried this obliquely down the leg.  We dissected down the tensor fascia lata muscle.  The tensor fascia was then divided longitudinally to proceed with a direct anterior approach to the hip. We then identified and cauterized the circumflex vessels and then identified the hip capsule.  I opened up the hip capsule in an L-type format, finding a large joint effusion and significant periarticular osteophytes around the femoral head.  We placed Cobra retractor around the medial and lateral femoral neck and then made our femoral neck cut proximal to the lesser trochanter with an oscillating saw and completed this with an osteotome.  We placed a corkscrew guide in the femoral head and removed the femoral head in its entirety and found it to be  wide area of devoid of cartilage.  We then placed a bent Hohmann over the medial acetabular rim and removed remnants of the acetabular labrum and other debris.  We then began broaching from a size 8 broach using the Corail broaching system going up to a size 13.  With the 13 in place, we trialed a standard offset femoral neck and a 36 +1.5 hip ball.  We reduced this in the acetabulum.  We felt comfortable with her leg length, offset, range of motion and stability on exam.  This was also verified under direct fluoroscopy.  We then dislocated the hip and removed the trial components.  We were able to place the real Corail femoral component size 13 with  standard offset and the real 36 +1.5 ceramic hip ball.  We reduced this in the acetabulum, and again, it was felt to be stable.  We then irrigated the soft tissue with normal saline solution using pulsatile lavage.  We closed the joint capsule with interrupted #1 Ethibond suture followed by running #1 Vicryl in the tensor fascia, 0 Vicryl in the deep tissue, 2-0 Vicryl subcuticular stitch and 4-0 Monocryl subcutaneous stitch.  Steri-Strips were applied to the skin.  An Aquacel dressing was placed.  She was taken off the Hana table and taken to the recovery room in stable condition.  All final counts were correct.  There were no complications noted.  Of note, Richardean Canal, PA-C, assisted in the entire case.  His assistance was crucial for facilitating all aspects of this case.     Vanita Panda. Magnus Ivan, M.D.     CYB/MEDQ  D:  10/10/2017  T:  10/11/2017  Job:  [id number]

## 2017-10-15 ENCOUNTER — Telehealth (INDEPENDENT_AMBULATORY_CARE_PROVIDER_SITE_OTHER): Payer: Self-pay | Admitting: Orthopaedic Surgery

## 2017-10-15 NOTE — Telephone Encounter (Signed)
Gracey (PT) with [facility] called left voicemail message advised she saw patient yesterday for (PT) assessment and need verbal orders for (PT) 2 wk 1, 3 wk 1 and 2 wk 1. The number to contact Gracey is [phone]

## 2017-10-15 NOTE — Telephone Encounter (Signed)
Verbal order given to Gracey

## 2017-10-20 ENCOUNTER — Telehealth (INDEPENDENT_AMBULATORY_CARE_PROVIDER_SITE_OTHER): Payer: Self-pay | Admitting: Orthopaedic Surgery

## 2017-10-20 ENCOUNTER — Other Ambulatory Visit (INDEPENDENT_AMBULATORY_CARE_PROVIDER_SITE_OTHER): Payer: Self-pay | Admitting: Orthopaedic Surgery

## 2017-10-20 MED ORDER — OXYCODONE HCL 5 MG PO TABS: 5.0000 mg | ORAL_TABLET | ORAL | 0 refills | Status: DC | PRN

## 2017-10-20 NOTE — Telephone Encounter (Signed)
Please advise 

## 2017-10-20 NOTE — Telephone Encounter (Signed)
I escribed some more in

## 2017-10-20 NOTE — Telephone Encounter (Signed)
Patient called needing Rx refilled (Oxycodone) The number to contact patient is [phone]

## 2017-10-27 ENCOUNTER — Ambulatory Visit (INDEPENDENT_AMBULATORY_CARE_PROVIDER_SITE_OTHER): Payer: BC Managed Care – PPO | Admitting: Orthopaedic Surgery

## 2017-10-27 ENCOUNTER — Encounter (INDEPENDENT_AMBULATORY_CARE_PROVIDER_SITE_OTHER): Payer: Self-pay | Admitting: Orthopaedic Surgery

## 2017-10-27 DIAGNOSIS — Z96642 Presence of left artificial hip joint: Secondary | ICD-10-CM

## 2017-10-27 MED ORDER — OXYCODONE HCL 5 MG PO TABS: 5.0000 mg | ORAL_TABLET | ORAL | 0 refills | Status: AC | PRN

## 2017-10-27 NOTE — Progress Notes (Signed)
HPI: Sabrina Singleton returns today 17 days status post left total hip arthroplasty.  She overall is doing well.  She does have some numbness about the incision site.  Some pain in the left knee.  She is ambulating with a cane.  No  shortness of breath chest pain fevers or chills.  She is on 81 mg aspirin twice daily.  Physical exam: General well-developed well-nourished female in no acute distress made affect appropriate Left calf supple nontender.  Surgical incisions healing well no signs of infection.  Impression: Status post left total hip arthroplasty  Plan: This point time she will finish up with home health PT.  She may need additional therapy in the future but for now will have her just walk mainly for exercise.  We will see her back in 1 month check progress lack of.  Scar tissue mobilization encouraged.  She will go to an 81 mg aspirin once daily for a week and then discontinue.  Refill on oxycodone was given.

## 2017-12-01 ENCOUNTER — Ambulatory Visit (INDEPENDENT_AMBULATORY_CARE_PROVIDER_SITE_OTHER): Payer: BC Managed Care – PPO | Admitting: Orthopaedic Surgery

## 2017-12-01 ENCOUNTER — Encounter (INDEPENDENT_AMBULATORY_CARE_PROVIDER_SITE_OTHER): Payer: Self-pay | Admitting: Orthopaedic Surgery

## 2017-12-01 DIAGNOSIS — Z96642 Presence of left artificial hip joint: Secondary | ICD-10-CM

## 2017-12-01 NOTE — Progress Notes (Signed)
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  She is 7 weeks status post a left total hip arthroplasty.  She still having some stiffness as it relates to her surgery.  On exam I can put her left hip through full range of motion and there is some stiffness and some pain still overall though she seems to be doing better.  I do feel that she would benefit from outpatient physical therapy as that she.  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  She would [facility].  We will work on getting this scheduled for her.  We will see her back in 4 weeks to see how she is doing overall.

## 2017-12-16 ENCOUNTER — Ambulatory Visit: Payer: BC Managed Care – PPO | Attending: Orthopaedic Surgery | Admitting: Physical Therapy

## 2017-12-16 ENCOUNTER — Other Ambulatory Visit: Payer: Self-pay

## 2017-12-16 DIAGNOSIS — M6281 Muscle weakness (generalized): Secondary | ICD-10-CM | POA: Diagnosis present

## 2017-12-16 DIAGNOSIS — M25652 Stiffness of left hip, not elsewhere classified: Secondary | ICD-10-CM | POA: Diagnosis not present

## 2017-12-16 NOTE — Patient Instructions (Signed)
   Hold all stretches for 60 seconds or more as tolerated.   Hamstring Stretch, Reclined (Strap, Doorframe)   Lengthen bottom leg on floor. Extend top leg along edge of doorframe or press foot up into yoga strap. Hold for 30 seconds. Repeat 3_ times each leg.  Knee-to-Chest Stretch: Unilateral    With hand behind right knee, pull knee in to chest until a comfortable stretch is felt in lower back and buttocks. Keep back relaxed. Hold _30___ seconds. Repeat _3___ times per set. Do ____ sets per session. Do __2__ sessions per day.   Standing inner thigh stretch. Shifting weight lunge.  Press ups to stretch hip flexors. 10 -20 times.   Butterfly stretch lying on your back.   Sabrina Singleton, PT 12/16/17 8:50 AM [facility] [address] [place], [place], [address] Phone: [phone]   Fax:  [phone]

## 2017-12-16 NOTE — Therapy (Signed)
[facility] [address] [place], [place], [address] Phone: [phone]   Fax:  [phone]  Physical Therapy Evaluation  Patient Details  Name: Sabrina Singleton MRN: [id number] Date of Birth: 1955/06/30 Referring Provider: Magnus Ivan   Encounter Date: 12/16/2017  PT End of Session - 12/16/17 0809    Visit Number  1    Number of Visits  16    Date for PT Re-Evaluation  02/10/18    PT Start Time  0809    PT Stop Time  0850    PT Time Calculation (min)  41 min    Activity Tolerance  Patient tolerated treatment well    Behavior During Therapy  [facility] for tasks assessed/performed       Past Medical History:  Diagnosis Date  . Arthritis   . Complication of anesthesia    as a very young child eye surgery  atropine  was given with anesthesia and pt. stopped breathing   . GERD (gastroesophageal reflux disease)    hx of 15 years ago no meds changed diet    Past Surgical History:  Procedure Laterality Date  . CESAREAN SECTION    . EYE SURGERY     as a child  . TONSILLECTOMY    . TOTAL HIP ARTHROPLASTY Left 10/10/2017   Procedure: LEFT TOTAL HIP ARTHROPLASTY ANTERIOR APPROACH;  Surgeon: Kathryne Hitch, MD;  Location: [facility];  Service: Orthopedics;  Laterality: Left;    There were no vitals filed for this visit.   Subjective Assessment - 12/16/17 0821    Subjective  Patient had a L THA on 10/10/17. She had therapy right after and did very well. Now by end of day she is limping due fatigue. She works in the yard and feels limited with ROM and fatigue. Patient reports her only pain is R shoulder and she also has limited ROM after using cane.     Pertinent History  L THA 10/10/17    Patient Stated Goals  to get stronger and improve ROM    Currently in Pain?  No/denies         [facility] PT Assessment - 12/16/17 0001      Assessment   Medical Diagnosis  s/p L THA    Referring Provider  Magnus Ivan    Onset Date/Surgical  Date  10/10/17    Next MD Visit  12/29/17      Precautions   Precautions  Anterior Hip    Precaution Comments  No forced extension with ER (like warrior I); No inclines right now  with walking      Balance Screen   Has the patient fallen in the past 6 months  No    Has the patient had a decrease in activity level because of a fear of falling?   No    Is the patient reluctant to leave their home because of a fear of falling?   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      Observation/Other Assessments   Focus on Therapeutic Outcomes (FOTO)   46% limited      Functional Tests   Functional tests  Single Leg Squat;Squat;Single leg stance      Squat   Comments  shifts to RLE      Single Leg Squat   Comments  weak on left and decreased ROM      Single Leg Stance   Comments  10 sec + bil      Posture/Postural Control   Posture Comments  WNL      ROM / Strength   AROM / PROM / Strength  AROM;Strength      AROM   Overall AROM Comments  rotation decreased 25% bil (IR of hip tighter than ER)    AROM Assessment Site  Lumbar;Hip    Right/Left Hip  Left    Left Hip Flexion  84 87    Left Hip External Rotation   -- unable to cross leg    Left Hip Internal Rotation   -- mild tightness    Left Hip ABduction  18 22 deg pas    Lumbar Flexion  full    Lumbar Extension  8    Lumbar - Right Side Bend  20    Lumbar - Left Side Bend  14    Lumbar - Right Rotation  75%    Lumbar - Left Rotation  75%       Strength   Overall Strength Comments  Bil hip flex 4+/5, ext 4+/5, ABD 5/5; Bil knees 5/5      Flexibility   Soft Tissue Assessment /Muscle Length  yes    Hamstrings  mild tightness    Quadriceps  WNL    Piriformis  mild tightness      Palpation   Spinal mobility  WNL to slight decrease with PA mobs    Palpation comment  unremarkable                Objective measurements completed on examination: See above findings.              PT Education - 12/16/17 1248    Education Details  HEP    Person(s) Educated  Patient    Methods  Explanation;Demonstration;Handout    Comprehension  Verbalized understanding;Returned demonstration       PT Short Term Goals - 12/16/17 1542      PT SHORT TERM GOAL #1   Title  patient to demo improved left hip flexion to 100 degrees to improve function    Time  4    Period  Weeks    Status  New    Target Date  01/13/18      PT SHORT TERM GOAL #2   Title  independent and compliant with HEP for flexibility    Time  4    Period  Weeks    Status  New    Target Date  01/13/18        PT Long Term Goals - 12/16/17 1545      PT LONG TERM GOAL #1   Title  Patient able to perform ADLs in her garden with normal flexiblity    Time  8    Status  New    Target Date  02/10/18      PT LONG TERM GOAL #  2   Title  Patient to demo improved flexibility in L hip to [facility] to perform ADLS.    Time  8    Period  Weeks    Status  New      PT LONG TERM GOAL #3   Title  Patient to demo good functional strength with squats to normalize ADLS.    Time  8    Period  Weeks    Status  New      PT LONG TERM GOAL #4   Title  Patient to report decreased fatigue by end of day by 75% or more    Time  8    Period  Weeks    Status  New             Plan - 12/16/17 1241    Clinical Impression Statement  Patient had a L THA on 10/10/17. She had therapy right after and did very well. Now by end of day she is limping due fatigue. She has  flexiblity deficits in her L hip adductors, extensors, flexors and rotators limiting ROM and function. She also demos functional weakness with squats and SL activities. Patient will benefit from PT to address these deficits.     History and Personal Factors relevant to plan of care:  arthritis, shoulder pain    Clinical Presentation  Stable    Clinical Decision Making  Low    Rehab Potential  Excellent    PT Frequency  2x / week    PT Duration  8 weeks    PT Treatment/Interventions  ADLs/Self Care Home Management;Moist Heat;Electrical Stimulation;Therapeutic exercise;Therapeutic activities;Stair training;Patient/family education;Manual techniques;Passive range of motion    PT Next Visit Plan  ROM/flexibility/ functional strengthening (squats, steps, SL activities)    PT Home Exercise Plan  HS stretch, prone press ups for hip flexors, SKTC, supine butterfly, stdy hip add stretch    Consulted and Agree with Plan of Care  Patient       Patient will benefit from skilled therapeutic intervention in order to improve the following deficits and impairments:  Decreased range of motion, Decreased activity tolerance, Decreased strength, Impaired flexibility  Visit Diagnosis: Stiffness of left hip, not elsewhere classified - Plan: PT plan of care cert/re-cert  Muscle weakness (generalized) - Plan: PT plan of care cert/re-cert     Problem List Patient Active Problem List   Diagnosis Date Noted  . Status post total replacement of left hip 10/10/2017  . Pain in left hip 06/26/2017  . Unilateral primary osteoarthritis, left hip 06/26/2017    Mohamud Mrozek PT 12/16/2017, 3:50 PM  [facility] [address] [place], [place], [address] Phone: [phone]   Fax:  [phone]  Name: BANEZA BARTOSZEK MRN: [id number] Date of Birth: [birth year]

## 2017-12-29 ENCOUNTER — Ambulatory Visit (INDEPENDENT_AMBULATORY_CARE_PROVIDER_SITE_OTHER): Payer: BC Managed Care – PPO | Admitting: Orthopaedic Surgery

## 2017-12-30 ENCOUNTER — Ambulatory Visit: Payer: BC Managed Care – PPO | Admitting: Physical Therapy

## 2018-01-01 ENCOUNTER — Ambulatory Visit: Payer: BC Managed Care – PPO | Admitting: Physical Therapy

## 2018-01-01 ENCOUNTER — Encounter: Payer: Self-pay | Admitting: Physical Therapy

## 2018-01-01 DIAGNOSIS — M6281 Muscle weakness (generalized): Secondary | ICD-10-CM

## 2018-01-01 DIAGNOSIS — M25652 Stiffness of left hip, not elsewhere classified: Secondary | ICD-10-CM

## 2018-01-01 NOTE — Therapy (Signed)
[facility] [address] [place], [place], [address] Phone: [phone]   Fax:  [phone]  Physical Therapy Treatment  Patient Details  Name: Sabrina Singleton MRN: [id number] Date of Birth: [birth year] Referring Provider: Magnus IvanBlackman   Encounter Date: 01/01/2018  PT End of Session - 01/01/18 0845    Visit Number  2    Number of Visits  16    Date for PT Re-Evaluation  02/10/18    PT Start Time  0800    PT Stop Time  0844    PT Time Calculation (min)  44 min    Activity Tolerance  Patient tolerated treatment well    Behavior During Therapy  [facility] for tasks assessed/performed       Past Medical History:  Diagnosis Date  . Arthritis   . Complication of anesthesia    as a very young child eye surgery  atropine  was given with anesthesia and pt. stopped breathing   . GERD (gastroesophageal reflux disease)    hx of 15 years ago no meds changed diet    Past Surgical History:  Procedure Laterality Date  . CESAREAN SECTION    . EYE SURGERY     as a child  . TONSILLECTOMY    . TOTAL HIP ARTHROPLASTY Left 10/10/2017   Procedure: LEFT TOTAL HIP ARTHROPLASTY ANTERIOR APPROACH;  Surgeon: Kathryne HitchBlackman, Christopher Y, MD;  Location: [facility];  Service: Orthopedics;  Laterality: Left;    There were no vitals filed for this visit.  Subjective Assessment - 01/01/18 0759    Subjective  Pt reports stiff all the way around but that the hip is great. Says the knees and hips just feel weak.     Currently in Pain?  No/denies                       [facility] Adult PT Treatment/Exercise - 01/01/18 0001      Ambulation/Gait   Stairs  Yes    Stairs Assistance  7: Independent    Stair Management Technique  One rail Right;Alternating pattern    Number of Stairs  24    Height of Stairs  6    Gait Comments  trying to reduce the use of the rail       Exercises   Exercises  Knee/Hip      Knee/Hip Exercises: Aerobic   Nustep   L3 5 min      Knee/Hip Exercises: Machines for Strengthening   Cybex Knee Extension  25lb 2x10    Cybex Knee Flexion  15lb. 2x10    Cybex Leg Press  40lb. 2x10     Other Machine  Rows and lats 20lb 2x10      Knee/Hip Exercises: Standing   Hip Abduction  Left;1 set;Knee straight;10 reps cable stack 5 lb.     Forward Step Up  Left;2 sets;10 reps;Hand Hold: 1;Step Height: 6"    Functional Squat  2 sets;10 reps assisted cable stack TRX style squat      Knee/Hip Exercises: Supine   Straight Leg Raises  Left;AROM;2 sets;10 reps hook lying with cueing for hard exhale              PT Education - 01/01/18 0849    Education Details  Pt reported doing her HEP regularly and not having any issues other than ER of the L hip.     Person(s) Educated  Patient    Methods  Explanation    Comprehension  Verbalized understanding       PT Short Term Goals - 12/16/17 1542      PT SHORT TERM GOAL #1   Title  patient to demo improved left hip flexion to 100 degrees to improve function    Time  4    Period  Weeks    Status  New    Target Date  01/13/18      PT SHORT TERM GOAL #2   Title  independent and compliant with HEP for flexibility    Time  4    Period  Weeks    Status  New    Target Date  01/13/18        PT Long Term Goals - 12/16/17 1545      PT LONG TERM GOAL #1   Title  Patient able to perform ADLs in her garden with normal flexiblity    Time  8    Status  New    Target Date  02/10/18      PT LONG TERM GOAL #2   Title  Patient to demo improved flexibility in L hip to [facility] to perform ADLS.    Time  8    Period  Weeks    Status  New      PT LONG TERM GOAL #3   Title  Patient to demo good functional strength with squats to normalize ADLS.    Time  8    Period  Weeks    Status  New      PT LONG TERM GOAL #4   Title  Patient to report decreased fatigue by end of day by 75% or more    Time  8    Period  Weeks    Status  New            Plan - 01/01/18 0846     Clinical Impression Statement  Pt tolerated treatment well. Pt described sit to stands as easy, but that deeper positions were more difficult and challenging when trying to get up out of the floor. Pt stated that the TRX style assisted squat felt great and that she hadn't been able to achieve that position other wise. Pt still carrying ton in the left hamstring that went away with verbal and tactile cues to breathe and adjust posture. Pt reported that she avoids using her left leg when doing stairs at home, but did well negotiating stairs in facility using a reciprocal gait pattern.    Rehab Potential  Excellent    PT Frequency  2x / week    PT Duration  8 weeks    PT Treatment/Interventions  ADLs/Self Care Home Management;Moist Heat;Electrical Stimulation;Therapeutic exercise;Therapeutic activities;Stair training;Patient/family education;Manual techniques;Passive range of motion    PT Next Visit Plan  ROM/flexibility/ functional strengthening (squats, steps, SL activities)       Patient will benefit from skilled therapeutic intervention in order to improve the following deficits and impairments:  Decreased range of motion, Decreased activity tolerance, Decreased strength, Impaired flexibility  Visit Diagnosis: Muscle weakness (generalized)  Stiffness of left hip, not elsewhere classified     Problem List Patient Active Problem List   Diagnosis Date Noted  . Status post total replacement of left hip 10/10/2017  . Pain in left hip 06/26/2017  . Unilateral primary osteoarthritis, left hip 06/26/2017    Margarita Mail, SPTA 01/01/2018, 8:51 AM  [facility] [address] [place], [place], [address] Phone: [phone]   Fax:  [phone]  Name: PHYLLIS ABELSON MRN: [id number] Date of Birth: 14-Jun-1956

## 2018-01-05 ENCOUNTER — Ambulatory Visit: Payer: BC Managed Care – PPO | Admitting: Physical Therapy

## 2018-01-05 DIAGNOSIS — M25652 Stiffness of left hip, not elsewhere classified: Secondary | ICD-10-CM | POA: Diagnosis not present

## 2018-01-05 DIAGNOSIS — M6281 Muscle weakness (generalized): Secondary | ICD-10-CM

## 2018-01-05 NOTE — Therapy (Signed)
[facility] [address] [place], [place], [address] Phone: [phone]   Fax:  [phone]  Physical Therapy Treatment  Patient Details  Name: Sabrina Singleton MRN: [id number] Date of Birth: 16-Aug-1955 Referring Provider: Magnus IvanBlackman   Encounter Date: 01/05/2018  PT End of Session - 01/05/18 0800    Visit Number  3    Number of Visits  16    Date for PT Re-Evaluation  02/10/18    PT Start Time  0800    PT Stop Time  0845    PT Time Calculation (min)  45 min    Activity Tolerance  Patient tolerated treatment well    Behavior During Therapy  [facility] for tasks assessed/performed       Past Medical History:  Diagnosis Date  . Arthritis   . Complication of anesthesia    as a very young child eye surgery  atropine  was given with anesthesia and pt. stopped breathing   . GERD (gastroesophageal reflux disease)    hx of 15 years ago no meds changed diet    Past Surgical History:  Procedure Laterality Date  . CESAREAN SECTION    . EYE SURGERY     as a child  . TONSILLECTOMY    . TOTAL HIP ARTHROPLASTY Left 10/10/2017   Procedure: LEFT TOTAL HIP ARTHROPLASTY ANTERIOR APPROACH;  Surgeon: Kathryne HitchBlackman, Christopher Y, MD;  Location: [facility];  Service: Orthopedics;  Laterality: Left;    There were no vitals filed for this visit.  Subjective Assessment - 01/05/18 0800    Subjective  Pt reports being exhausted from a weekend with little sleep. Reports hip being tight but with no pain.     Currently in Pain?  No/denies                       [facility] Treatment/Exercise - 01/05/18 0001      Ambulation/Gait   Stairs  Yes    Stairs Assistance  7: Independent    Stair Management Technique  No rails    Number of Stairs  48    Height of Stairs  6    Gait Comments  was able to stop using the rail for assistance. Focus on eccentric lowering on the left and reciprocal step pattern on way up.       Knee/Hip  Exercises: Aerobic   Recumbent Bike  L1 6 min      Knee/Hip Exercises: Machines for Strengthening   Cybex Knee Extension  15lb. 2x10    Cybex Knee Flexion  25 lb. 2x10    Cybex Leg Press  40lb. 2x10     Other Machine  Rows and lats 20lb 2x10      Knee/Hip Exercises: Standing   Hip Abduction  Left;Knee straight;10 reps;2 sets 5lb.    Forward Step Up  Step Height: 6";Hand Hold: 0;Both;3 sets;5 reps    Functional Squat  2 sets;10 reps    Walking with Sports Cord  Side stepping both ways 3 ea way 30 lb.               PT Short Term Goals - 12/16/17 1542      PT SHORT TERM GOAL #1   Title  patient to demo improved left hip flexion to 100 degrees to improve function    Time  4    Period  Weeks    Status  New    Target  Date  01/13/18      PT SHORT TERM GOAL #2   Title  independent and compliant with HEP for flexibility    Time  4    Period  Weeks    Status  New    Target Date  01/13/18        PT Long Term Goals - 12/16/17 1545      PT LONG TERM GOAL #1   Title  Patient able to perform ADLs in her garden with normal flexiblity    Time  8    Status  New    Target Date  02/10/18      PT LONG TERM GOAL #2   Title  Patient to demo improved flexibility in L hip to [facility] to perform ADLS.    Time  8    Period  Weeks    Status  New      PT LONG TERM GOAL #3   Title  Patient to demo good functional strength with squats to normalize ADLS.    Time  8    Period  Weeks    Status  New      PT LONG TERM GOAL #4   Title  Patient to report decreased fatigue by end of day by 75% or more    Time  8    Period  Weeks    Status  New            Plan - 01/05/18 0850    Clinical Impression Statement  Pt tolerated treatment well. Reported that previous session rows and lats exercises improved the feeling of her shoulder. Pt tolerated an increase in stair activity well, with a focus on decreased assistance (railing) and eccentric lowering of LLE. Pt has been climbing stairs  and doing laundry for daughter all weekend without an increase in symptoms. Pt required verbal cues to distribute weight through the posterior chain during squat activities to reduce sensation in knee.    PT Frequency  2x / week    PT Duration  8 weeks    PT Treatment/Interventions  ADLs/Self Care Home Management;Moist Heat;Electrical Stimulation;Therapeutic exercise;Therapeutic activities;Stair training;Patient/family education;Manual techniques;Passive range of motion    PT Next Visit Plan  ROM/flexibility/ functional strengthening (squats, steps, SL activities)    PT Home Exercise Plan  HS stretch, prone press ups for hip flexors, SKTC, supine butterfly, stdy hip add stretch       Patient will benefit from skilled therapeutic intervention in order to improve the following deficits and impairments:  Decreased range of motion, Decreased activity tolerance, Decreased strength, Impaired flexibility  Visit Diagnosis: Muscle weakness (generalized)  Stiffness of left hip, not elsewhere classified     Problem List Patient Active Problem List   Diagnosis Date Noted  . Status post total replacement of left hip 10/10/2017  . Pain in left hip 06/26/2017  . Unilateral primary osteoarthritis, left hip 06/26/2017    Margarita Mail, SPTA 01/05/2018, 8:55 AM  [facility] [address] [place], [place], [address] Phone: [phone]   Fax:  [phone]  Name: Sabrina Singleton MRN: [id number] Date of Birth: 10-28-1955

## 2018-01-09 ENCOUNTER — Ambulatory Visit: Payer: BC Managed Care – PPO | Admitting: Physical Therapy

## 2018-01-14 ENCOUNTER — Ambulatory Visit (INDEPENDENT_AMBULATORY_CARE_PROVIDER_SITE_OTHER): Payer: BC Managed Care – PPO | Admitting: Orthopaedic Surgery

## 2018-01-15 ENCOUNTER — Ambulatory Visit: Payer: BC Managed Care – PPO | Attending: Orthopaedic Surgery | Admitting: Physical Therapy

## 2018-01-15 DIAGNOSIS — M25652 Stiffness of left hip, not elsewhere classified: Secondary | ICD-10-CM | POA: Insufficient documentation

## 2018-01-15 DIAGNOSIS — M6281 Muscle weakness (generalized): Secondary | ICD-10-CM | POA: Insufficient documentation

## 2018-01-15 DIAGNOSIS — M25571 Pain in right ankle and joints of right foot: Secondary | ICD-10-CM | POA: Diagnosis present

## 2018-01-15 NOTE — Therapy (Signed)
[facility] [address] [place], [place], [address] Phone: [phone]   Fax:  [phone]  Physical Therapy Treatment  Patient Details  Name: Sabrina Singleton MRN: [id number] Date of Birth: [birth year] Referring Provider: Magnus IvanBlackman   Encounter Date: 01/15/2018  PT End of Session - 01/15/18 1219    Visit Number  4    Number of Visits  16    Date for PT Re-Evaluation  02/10/18    PT Start Time  1145    PT Stop Time  1226    PT Time Calculation (min)  41 min    Activity Tolerance  Patient tolerated treatment well    Behavior During Therapy  [facility] for tasks assessed/performed       Past Medical History:  Diagnosis Date  . Arthritis   . Complication of anesthesia    as a very young child eye surgery  atropine  was given with anesthesia and pt. stopped breathing   . GERD (gastroesophageal reflux disease)    hx of 15 years ago no meds changed diet    Past Surgical History:  Procedure Laterality Date  . CESAREAN SECTION    . EYE SURGERY     as a child  . TONSILLECTOMY    . TOTAL HIP ARTHROPLASTY Left 10/10/2017   Procedure: LEFT TOTAL HIP ARTHROPLASTY ANTERIOR APPROACH;  Surgeon: Kathryne HitchBlackman, Christopher Y, MD;  Location: [facility];  Service: Orthopedics;  Laterality: Left;    There were no vitals filed for this visit.  Subjective Assessment - 01/15/18 1154    Subjective  Pt report doing okay. Hip doing well, but reported moving around massage table and other furniture item aggrivated her R ankle and it is swollen/difficult to do stairs. Pain in ankle is 5/10.     Currently in Pain?  Yes    Pain Score  1     Pain Location  Hip    Pain Orientation  Anterior;Left                       [facility] Adult PT Treatment/Exercise - 01/15/18 0001      Knee/Hip Exercises: Aerobic   Other Aerobic  UBE L3 823fd/[id number]      Knee/Hip Exercises: Seated   Ball Squeeze  20 2 sets    Clamshell with TheraBand  Red 2x20    Marching  Both;2 sets;10 reps;Weights 3lb.      Knee/Hip Exercises: Supine   Straight Leg Raises  Both;2 sets;10 reps Cues for hard exhale/ breath cycle    Other Supine Knee/Hip Exercises  Yellow band AAROM adduction sliding across mat table with clinician supporting weight of leg. LLE 2x10      Knee/Hip Exercises: Sidelying   Hip ABduction  Both;2 sets;10 reps               PT Short Term Goals - 12/16/17 1542      PT SHORT TERM GOAL #1   Title  patient to demo improved left hip flexion to 100 degrees to improve function    Time  4    Period  Weeks    Status  New    Target Date  01/13/18      PT SHORT TERM GOAL #2   Title  independent and compliant with HEP for flexibility    Time  4    Period  Weeks    Status  New    Target Date  01/13/18        PT Long Term Goals - 12/16/17 1545      PT LONG TERM GOAL #1   Title  Patient able to perform ADLs in her garden with normal flexiblity    Time  8    Status  New    Target Date  02/10/18      PT LONG TERM GOAL #2   Title  Patient to demo improved flexibility in L hip to [facility] to perform ADLS.    Time  8    Period  Weeks    Status  New      PT LONG TERM GOAL #3   Title  Patient to demo good functional strength with squats to normalize ADLS.    Time  8    Period  Weeks    Status  New      PT LONG TERM GOAL #4   Title  Patient to report decreased fatigue by end of day by 75% or more    Time  8    Period  Weeks    Status  New            Plan - 01/15/18 1219    Clinical Impression Statement  Pt tolerated a modified treatment well. Interventions done to reduce weight bearing on affected ankle. Pt described a tingling sensation in the scar on the L hip. Stated she is attending a scar massage clinic soon and SPTA educated on how she can perform self scar massage to potentially reduce pain and improve movement. Pt reported the hip felt good after the hip abd/add exercises. Upon palpation of R ankle it was markedly  warm compared to other side. SPTA advised her to perform RICE when she could. Pt stated she would go home and ice R ankle, but that L hip felt good.     Rehab Potential  Excellent    PT Frequency  2x / week    PT Duration  8 weeks    PT Treatment/Interventions  ADLs/Self Care Home Management;Moist Heat;Electrical Stimulation;Therapeutic exercise;Therapeutic activities;Stair training;Patient/family education;Manual techniques;Passive range of motion    PT Next Visit Plan  ROM/flexibility/ functional strengthening (squats, steps, SL activities), check to insure ankle can tolerate        Patient will benefit from skilled therapeutic intervention in order to improve the following deficits and impairments:  Decreased range of motion, Decreased activity tolerance, Decreased strength, Impaired flexibility  Visit Diagnosis: Muscle weakness (generalized)  Stiffness of left hip, not elsewhere classified     Problem List Patient Active Problem List   Diagnosis Date Noted  . Status post total replacement of left hip 10/10/2017  . Pain in left hip 06/26/2017  . Unilateral primary osteoarthritis, left hip 06/26/2017    Margarita Mail, SPTA 01/15/2018, 12:28 PM  [facility] [place] [place] [place] [facility] [address] [place], [place], [address] Phone: [phone]   Fax:  [phone]  Name: Sabrina Singleton MRN: [id number] Date of Birth: Nov 14, 1955

## 2018-01-23 ENCOUNTER — Ambulatory Visit: Payer: BC Managed Care – PPO | Admitting: Physical Therapy

## 2018-01-23 DIAGNOSIS — M6281 Muscle weakness (generalized): Secondary | ICD-10-CM | POA: Diagnosis not present

## 2018-01-23 DIAGNOSIS — M25652 Stiffness of left hip, not elsewhere classified: Secondary | ICD-10-CM

## 2018-01-23 NOTE — Patient Instructions (Addendum)
Over Head Pull: Narrow Grip     - perform with bridges     On back, knees bent, feet flat, band across thighs, elbows straight but relaxed. Pull hands apart (start). Keeping elbows straight, bring arms up and over head, hands toward floor. Keep pull steady on band. Hold momentarily. Return slowly, keeping pull steady, back to start. Repeat ___ times. Band color ______   Copyright  VHI. All rights reserved.  Side Pull: Double Arm    On back, knees bent, feet flat. Arms perpendicular to body, shoulder level, elbows straight but relaxed. Pull arms out to sides, elbows straight. Resistance band comes across collarbones, hands toward floor. Hold momentarily. Slowly return to starting position. Repeat ___ times. Band color _____    Copyright  VHI. All rights reserved.

## 2018-01-23 NOTE — Therapy (Signed)
[facility] [address] [place], [place], [address] Phone: [phone]   Fax:  [phone]  Physical Therapy Treatment  Patient Details  Name: Sabrina Singleton Marando MRN: [id number] Date of Birth: 1955/10/04 Referring Provider: Magnus IvanBlackman   Encounter Date: 01/23/2018  PT End of Session - 01/23/18 1057    Visit Number  5    Number of Visits  16    Date for PT Re-Evaluation  02/10/18    PT Start Time  1059    PT Stop Time  1150    PT Time Calculation (min)  51 min    Activity Tolerance  Patient tolerated treatment well       Past Medical History:  Diagnosis Date  . Arthritis   . Complication of anesthesia    as a very young child eye surgery  atropine  was given with anesthesia and pt. stopped breathing   . GERD (gastroesophageal reflux disease)    hx of 15 years ago no meds changed diet    Past Surgical History:  Procedure Laterality Date  . CESAREAN SECTION    . EYE SURGERY     as a child  . TONSILLECTOMY    . TOTAL HIP ARTHROPLASTY Left 10/10/2017   Procedure: LEFT TOTAL HIP ARTHROPLASTY ANTERIOR APPROACH;  Surgeon: Kathryne HitchBlackman, Christopher Y, MD;  [facility];  Service: Orthopedics;  Laterality: Left;    There were no vitals filed for this visit.  Subjective Assessment - 01/23/18 1101    Subjective  Pt reports her Rt calf still is strained and bothering her. Also tightness in Rt shoulder    Currently in Pain?  Yes    Pain Score  4     Pain Location  Leg    Pain Orientation  Right;Medial;Lower   calf   Pain Descriptors / Indicators  Aching    Pain Type  Acute pain    Pain Onset  1 to 4 weeks ago    Pain Frequency  Intermittent    Aggravating Factors   walking and stairs    Pain Relieving Factors  ice, elevation, rest         [facility] - 01/23/18 0001      Assessment   Medical Diagnosis  s/p L THA                   [facility] Adult PT Treatment/Exercise - 01/23/18 0001       Self-Care   Self-Care  Other Self-Care Comments    Other Self-Care Comments   modified HEP - encouraged pt to hold on standing gastroc stretches and lunges for now to rest rt achilles      Exercises   Exercises  Knee/Hip      Knee/Hip Exercises: Stretches   Other Knee/Hip Stretches  modified hip rotator stretching with LT ankle on Rt knee      Knee/Hip Exercises: Aerobic   Nustep  L3 6 min      Modalities   Modalities  Ultrasound      Ultrasound   Ultrasound Location  medial Rt achilles    Ultrasound Parameters  20%, 1.560mHz, 0.80w/cm2    Ultrasound Goals  Pain      Manual Therapy   Manual Therapy  Soft tissue mobilization;Taping;Joint mobilization    Joint Mobilization  Lt hip PA mobs followed by mobs into ER and IR    Soft tissue mobilization  cross friction massage to medial Rt achllies -  pt with bulge    Kinesiotex  [employer];Facilitate Muscle      Kinesiotix   Create Space  i strip across bulge in achilles Rt    Facilitate Muscle   I strip long from heel to upper gastroc medially       10 reps, red band, UE overhead pull and horizontal abduction with bridges.         PT Short Term Goals - 12/16/17 1542      PT SHORT TERM GOAL #1   Title  patient to demo improved left hip flexion to 100 degrees to improve function    Time  4    Period  Weeks    Status  New    Target Date  01/13/18      PT SHORT TERM GOAL #2   Title  independent and compliant with HEP for flexibility    Time  4    Period  Weeks    Status  New    Target Date  01/13/18        PT Long Term Goals - 12/16/17 1545      PT LONG TERM GOAL #1   Title  Patient able to perform ADLs in her garden with normal flexiblity    Time  8    Status  New    Target Date  02/10/18      PT LONG TERM GOAL #2   Title  Patient to demo improved flexibility in L hip to [facility] to perform ADLS.    Time  8    Period  Weeks    Status  New      PT LONG TERM GOAL #3   Title  Patient to demo good functional  strength with squats to normalize ADLS.    Time  8    Period  Weeks    Status  New      PT LONG TERM GOAL #4   Title  Patient to report decreased fatigue by end of day by 75% or more    Time  8    Period  Weeks    Status  New            Plan - 01/23/18 1157    Clinical Impression Statement  Sireen reports she feels very limited in function and activity level due to her Rt achilles pain, this was focused on today and she reported relief with the [place] and taping.  She felt improved Lt hip rotation after manual work and stretching.  SLow progress to goals.  Continues with limitations and pain in the LE's.  Added in some upper body exercise with bands to be performed with bridges.     Rehab Potential  Excellent    PT Frequency  2x / week    PT Duration  8 weeks    PT Treatment/Interventions  ADLs/Self Care Home Management;Moist Heat;Electrical Stimulation;Therapeutic exercise;Therapeutic activities;Stair training;Patient/family education;Manual techniques;Passive range of motion    PT Next Visit Plan  assess response to [place] and taping of Rt achilles, manual work Lt hip to improve motion and strengthening.     Consulted and Agree with Plan of Care  Patient       Patient will benefit from skilled therapeutic intervention in order to improve the following deficits and impairments:  Decreased range of motion, Decreased activity tolerance, Decreased strength, Impaired flexibility  Visit Diagnosis: Muscle weakness (generalized)  Stiffness of left hip, not elsewhere classified     Problem List Patient  Active Problem List   Diagnosis Date Noted  . Status post total replacement of left hip 10/10/2017  . Pain in left hip 06/26/2017  . Unilateral primary osteoarthritis, left hip 06/26/2017    Rose Fillers rP T 01/23/2018, 12:02 PM  [facility] [address] [place], [place], [address] Phone: [phone]   Fax:   [phone]  Name: Sabrina Singleton MRN: [id number] Date of Birth: 04-16-1956

## 2018-02-02 ENCOUNTER — Ambulatory Visit: Payer: BC Managed Care – PPO | Admitting: Physical Therapy

## 2018-02-02 DIAGNOSIS — M6281 Muscle weakness (generalized): Secondary | ICD-10-CM

## 2018-02-02 DIAGNOSIS — M25652 Stiffness of left hip, not elsewhere classified: Secondary | ICD-10-CM

## 2018-02-02 DIAGNOSIS — M25571 Pain in right ankle and joints of right foot: Secondary | ICD-10-CM

## 2018-02-02 NOTE — Therapy (Signed)
[place], [place], [address] Phone: [phone]   Fax:  [phone]  Physical Therapy Treatment  Patient Details  Name: Sabrina Singleton MRN: [id number] Date of Birth: [birth year] Referring Provider: Ninfa Linden   Encounter Date: 02/02/2018  PT End of Session - 02/02/18 0804    Visit Number  6    Number of Visits  16    Date for PT Re-Evaluation  03/02/18    PT Start Time  0803    PT Stop Time  9292    PT Time Calculation (min)  44 min    Activity Tolerance  Patient tolerated treatment well    Behavior During Therapy  [facility] for tasks assessed/performed       Past Medical History:  Diagnosis Date  . Arthritis   . Complication of anesthesia    as a very young child eye surgery  atropine  was given with anesthesia and pt. stopped breathing   . GERD (gastroesophageal reflux disease)    hx of 15 years ago no meds changed diet    Past Surgical History:  Procedure Laterality Date  . CESAREAN SECTION    . EYE SURGERY     as a child  . TONSILLECTOMY    . TOTAL HIP ARTHROPLASTY Left 10/10/2017   Procedure: LEFT TOTAL HIP ARTHROPLASTY ANTERIOR APPROACH;  Surgeon: Mcarthur Rossetti, MD;  [facility];  Service: Orthopedics;  Laterality: Left;    There were no vitals filed for this visit.  Subjective Assessment - 02/02/18 0804    Subjective  Patient reports 9/10 in R heel with ascending and descending stairs. She reports improved mobility in her L hip.     Pertinent History  L THA 10/10/17    Patient Stated Goals  to get stronger and improve ROM    Currently in Pain?  Yes    Pain Score  1    9/10 with stairs   Pain Location  Heel    Pain Orientation  Right    Pain Descriptors / Indicators  Aching    Pain Type  Acute pain         [facility] PT Assessment - 02/02/18 0001      AROM   Left Hip Flexion  95   100 passive   Left Hip ABduction  30                   [facility] Adult  PT Treatment/Exercise - 02/02/18 0001      Exercises   Exercises  Ankle;Knee/Hip      Knee/Hip Exercises: Stretches   Piriformis Stretch  Left;1 rep;60 seconds    Other Knee/Hip Stretches  SKTC x 60 sec      Knee/Hip Exercises: Aerobic   Nustep  L3 6 min      Knee/Hip Exercises: Standing   Functional Squat  2 sets;10 reps      Knee/Hip Exercises: Supine   Bridges  Strengthening;Both;1 set;10 reps   with post pelvic tilt in bridge position to stretch HF     Kinesiotix   Create Space  i strip across bulge in achilles Rt    Facilitate Muscle   I strip long from heel to upper gastroc medially      Ankle Exercises: Stretches   Other Stretch  toe extension stretch (deep squat on toes) 2x60 sec      Ankle Exercises: Standing   Heel Raises  Both;5 reps  10 sec hold            PT Education - 02/02/18 1616    Education Details  HEP - squat on toes, isometric heel raise    Person(s) Educated  Patient    Methods  Explanation;Demonstration    Comprehension  Verbalized understanding;Returned demonstration       PT Short Term Goals - 02/02/18 6195      PT SHORT TERM GOAL #1   Title  patient to demo improved left hip flexion to 100 degrees to improve function    Time  4    Period  Weeks    Status  On-going      PT SHORT TERM GOAL #2   Title  independent and compliant with HEP for flexibility    Time  4    Period  Weeks    Status  Achieved        PT Long Term Goals - 02/02/18 1618      PT LONG TERM GOAL #1   Title  Patient able to perform ADLs in her garden with normal flexiblity    Time  8    Period  Weeks    Status  Achieved      PT LONG TERM GOAL #2   Title  Patient to demo improved flexibility in L hip to [facility] to perform ADLS.    Time  8    Period  Weeks    Status  Partially Met      PT LONG TERM GOAL #3   Title  Patient to demo good functional strength with squats to normalize ADLS.    Time  8    Period  Weeks    Status  Achieved      PT LONG  TERM GOAL #4   Title  Patient to report decreased fatigue by end of day by 75% or more    Time  8    Period  Weeks    Status  Partially Met            Plan - 02/02/18 1616    Clinical Impression Statement  Patient feels her hip is doing really well and her main complaint now is her R achilles tendon. She has gained ROM in the L hip but is still limited with flexion. Her functional squats are good with minimal deviation to R side.  She has pain up to 9/10 in the R heel with stairs. She tolerated isometric strengthening well today and responded well to KT tape as well.    PT Frequency  2x / week    PT Duration  8 weeks    PT Treatment/Interventions  ADLs/Self Care Home Management;Moist Heat;Electrical Stimulation;Therapeutic exercise;Therapeutic activities;Stair training;Patient/family education;Manual techniques;Passive range of motion    PT Next Visit Plan  Continue flexibility of L hip and assess response to taping and TE for Rt achilles    PT Home Exercise Plan  HS stretch, prone press ups for hip flexors, SKTC, supine butterfly, stdy hip add stretch, squat on toes for plantar stretch, isometric heel raise    Consulted and Agree with Plan of Care  Patient       Patient will benefit from skilled therapeutic intervention in order to improve the following deficits and impairments:  Decreased range of motion, Decreased activity tolerance, Decreased strength, Impaired flexibility  Visit Diagnosis: Muscle weakness (generalized) - Plan: PT plan of care cert/re-cert  Stiffness of left hip, not elsewhere classified - Plan: PT plan of  care cert/re-cert  Pain in right ankle and joints of right foot - Plan: PT plan of care cert/re-cert     Problem List Patient Active Problem List   Diagnosis Date Noted  . Status post total replacement of left hip 10/10/2017  . Pain in left hip 06/26/2017  . Unilateral primary osteoarthritis, left hip 06/26/2017    Nealy Hickmon PT 02/02/2018, 4:29  PM  [address] [place], [place], [address] Phone: [phone]   Fax:  [phone]  Name: Sabrina Singleton MRN: [id number] Date of Birth: 08/10/1955

## 2018-02-03 ENCOUNTER — Encounter (INDEPENDENT_AMBULATORY_CARE_PROVIDER_SITE_OTHER): Payer: Self-pay | Admitting: Orthopaedic Surgery

## 2018-02-03 ENCOUNTER — Ambulatory Visit (INDEPENDENT_AMBULATORY_CARE_PROVIDER_SITE_OTHER): Payer: BC Managed Care – PPO | Admitting: Orthopaedic Surgery

## 2018-02-03 DIAGNOSIS — M7661 Achilles tendinitis, right leg: Secondary | ICD-10-CM | POA: Diagnosis not present

## 2018-02-03 DIAGNOSIS — Z96642 Presence of left artificial hip joint: Secondary | ICD-10-CM | POA: Diagnosis not present

## 2018-02-03 NOTE — Progress Notes (Signed)
The patient is almost 4 months now status post a left total hip arthroplasty.  Left hip is doing well but she is having problems with her right ankle.  She points to the Achilles area of the right ankle source of her pain.  She denies any injury there but has had some swelling.  On exam her Janee Mornhompson test is negative.  She does have pain over the Achilles tendon on the right side.  Her left hip exam is entirely normal.  I gave her Thera-Band and showed her stretching exercise to try for Achilles tendinitis.  She will avoid open back shoes as well.  From my standpoint I need to see her back for 6 months.  I like a low AP pelvis and lateral of her left hip at that visit.  If she is having issues with Achilles before then or any further problems she will let us know to be seen before then.

## 2018-02-09 ENCOUNTER — Ambulatory Visit: Payer: BC Managed Care – PPO | Admitting: Physical Therapy

## 2018-02-09 DIAGNOSIS — M6281 Muscle weakness (generalized): Secondary | ICD-10-CM | POA: Diagnosis not present

## 2018-02-09 DIAGNOSIS — M25652 Stiffness of left hip, not elsewhere classified: Secondary | ICD-10-CM

## 2018-02-09 NOTE — Patient Instructions (Signed)
Sit to Stand    Sit on edge of chair, feet flat on floor. Stand upright, extending knees fully. Repeat 10 times per set. Do _1-3 sets per session. Do __1-2__ sessions per day.  http://orth.exer.us/734   Back Wall Slide    With feet _10___ inches from wall, lean as much of back against the wall as possible. Gently squat down, keeping back against wall. Hold _5-10___ seconds while counting out loud. Repeat __10-30__ times. Do _1-2___ sessions per day.  http://[email or web address].exer.us/563    Anterior Heel tap    Stand with both feet on _6-8__ inch step. Step down with _R__ foot, touching heel to the floor and return 10_ times. Do not put weight through this heel. _1-3__ sets __1-2_ times per day.  http://gglj.exer.us/185    Proprioception, Quad Strength, Timing, Coordination: Forward Step-Up    Move onto step with one foot, then the other. Step back down with ___ foot. Use 6-8____ inch step. Repeat _10-30___ times or for ____ minutes. Do __1-2__ sessions per day.  http://cc.exer.us/4    Solon PalmJulie Rafael Quesada, PT 02/09/18 8:41 AM; [facility] [address] [place], [place], [address] Phone: [phone]   Fax:  [phone]

## 2018-02-09 NOTE — Therapy (Signed)
[facility] [address] [place], [place], [address] Phone: [phone]   Fax:  [phone]  Physical Therapy Treatment  Patient Details  Name: Sabrina Singleton MRN: [id number] Date of Birth: 02-22-1956 Referring Provider: Magnus Ivan   Encounter Date: 02/09/2018  PT End of Session - 02/09/18 0808    Visit Number  7    Number of Visits  16    Date for PT Re-Evaluation  03/02/18    PT Start Time  0802    PT Stop Time  0842    PT Time Calculation (min)  40 min    Activity Tolerance  Patient tolerated treatment well    Behavior During Therapy  [facility] for tasks assessed/performed       Past Medical History:  Diagnosis Date  . Arthritis   . Complication of anesthesia    as a very young child eye surgery  atropine  was given with anesthesia and pt. stopped breathing   . GERD (gastroesophageal reflux disease)    hx of 15 years ago no meds changed diet    Past Surgical History:  Procedure Laterality Date  . CESAREAN SECTION    . EYE SURGERY     as a child  . TONSILLECTOMY    . TOTAL HIP ARTHROPLASTY Left 10/10/2017   Procedure: LEFT TOTAL HIP ARTHROPLASTY ANTERIOR APPROACH;  Surgeon: Kathryne Hitch, MD;  Location: [facility];  Service: Orthopedics;  Laterality: Left;    There were no vitals filed for this visit.  Subjective Assessment - 02/09/18 0806    Subjective  Patient saw her MD and he is very pleased with the progress of her hip. He gave her some exercises for her tendonitis. She continues to c/o pain in the R ankle with stairs, but thinks it is a little better.    Pertinent History  L THA 10/10/17    Patient Stated Goals  to get stronger and improve ROM    Currently in Pain?  No/denies         [facility] PT Assessment - 02/09/18 0001      AROM   Left Hip Flexion  100                   [facility] Adult PT Treatment/Exercise - 02/09/18 0001      Exercises   Exercises  Knee/Hip      Knee/Hip  Exercises: Aerobic   Elliptical  Incline 5, R 2 x 5 min    Nustep  L3 6 min    Other Aerobic  --      Knee/Hip Exercises: Standing   Forward Step Up  Both;2 sets;20 reps;Hand Hold: 0;Step Height: 8"    Step Down  Right;3 sets;10 reps;Hand Hold: 1;Step Height: 4";Step Height: 6";Step Height: 8"   10 reps each height   Lunge Walking - Round Trips  in place x 5 left side    SLS with Vectors  bil x 2 reps each side    Other Standing Knee Exercises  SLS knee raise to partial deadlift position x 4 each side               PT Short Term Goals - 02/09/18 0843      PT SHORT TERM GOAL #1   Title  patient to demo improved left hip flexion to 100 degrees to improve function    Baseline  --    Time  4    Period  Weeks    Status  Achieved      PT SHORT TERM GOAL #2   Title  independent and compliant with HEP for flexibility    Time  4    Period  Weeks    Status  Achieved        PT Long Term Goals - 02/09/18 1122      PT LONG TERM GOAL #5   Title  Patient to be able to perform higher level SLS activities without LOB (specifically dance positions)    Time  2    Period  Weeks    Status  New            Plan - 02/09/18 1124    Clinical Impression Statement  Patient continues to improve with her hip flexibility. 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 she also demos eccentric quad weakness R>L.     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       Patient will benefit from skilled therapeutic intervention in order to improve the following deficits and impairments:  Decreased range of motion, Decreased activity tolerance, Decreased strength,  Impaired flexibility  Visit Diagnosis: Muscle weakness (generalized)  Stiffness of left hip, not elsewhere classified     Problem List Patient Active Problem List   Diagnosis Date Noted  . Status post total replacement of left hip 10/10/2017  . Pain in left hip 06/26/2017  . Unilateral primary osteoarthritis, left hip 06/26/2017    Lita Flynn PT 02/09/2018, 11:38 AM  [facility] [address] [place], [place], [address] Phone: [phone]   Fax:  [phone]  Name: Sabrina Singleton MRN: [id number] Date of Birth: November 21, 1955

## 2018-02-18 ENCOUNTER — Ambulatory Visit: Payer: BC Managed Care – PPO | Attending: Orthopaedic Surgery | Admitting: Physical Therapy

## 2018-02-18 DIAGNOSIS — M6281 Muscle weakness (generalized): Secondary | ICD-10-CM | POA: Diagnosis not present

## 2018-02-18 DIAGNOSIS — M25652 Stiffness of left hip, not elsewhere classified: Secondary | ICD-10-CM | POA: Diagnosis present

## 2018-02-18 DIAGNOSIS — M25571 Pain in right ankle and joints of right foot: Secondary | ICD-10-CM | POA: Diagnosis present

## 2018-02-18 NOTE — Therapy (Signed)
[place], [place], [address] Phone: [phone]   Fax:  [phone]  Physical Therapy Treatment  Patient Details  Name: Sabrina Singleton MRN: [id number] Date of Birth: [birth year] Referring Provider: Ninfa Linden   Encounter Date: 02/18/2018  PT End of Session - 02/18/18 0808    Visit Number  8    Number of Visits  16    Date for PT Re-Evaluation  03/02/18    PT Start Time  0803    PT Stop Time  0844    PT Time Calculation (min)  41 min    Activity Tolerance  Patient tolerated treatment well    Behavior During Therapy  [facility] for tasks assessed/performed       Past Medical History:  Diagnosis Date  . Arthritis   . Complication of anesthesia    as a very young child eye surgery  atropine  was given with anesthesia and pt. stopped breathing   . GERD (gastroesophageal reflux disease)    hx of 15 years ago no meds changed diet    Past Surgical History:  Procedure Laterality Date  . CESAREAN SECTION    . EYE SURGERY     as a child  . TONSILLECTOMY    . TOTAL HIP ARTHROPLASTY Left 10/10/2017   Procedure: LEFT TOTAL HIP ARTHROPLASTY ANTERIOR APPROACH;  Surgeon: Mcarthur Rossetti, MD;  Location: [facility];  Service: Orthopedics;  Laterality: Left;    There were no vitals filed for this visit.  Subjective Assessment - 02/18/18 0808    Subjective  Patient continues to report improvement in L hip flexiblity, but complain of 3/10 pain in R achilles.    Patient Stated Goals  to get stronger and improve ROM    Currently in Pain?  No/denies                       [facility] Adult PT Treatment/Exercise - 02/18/18 0001      Exercises   Exercises  Knee/Hip      Knee/Hip Exercises: Stretches   Gastroc Stretch  Both;2 reps;30 seconds    Soleus Stretch  2 reps;30 seconds    Other Knee/Hip Stretches  squat stretch for arch 2x30 sec      Knee/Hip Exercises: Aerobic   Elliptical  Incline 5,  R 2 x 6 min      Knee/Hip Exercises: Standing   Lateral Step Up  Both;2 sets;10 reps;Hand Hold: 0   Bosu   Forward Step Up  Both;Hand Hold: 0;2 sets;10 reps   on Bosu   Step Down  Both;2 sets;10 reps;Hand Hold: 1;Hand Hold: 2;Step Height: 6";Step Height: 8"   heel taps; 1 set each height   Lunge Walking - Round Trips  1 walking: in place x 16 left side (fatigue); R side to fatigue    SLS with Vectors  on foam to fatigue bil    Other Standing Knee Exercises  SLS knee raise to partial deadlift position x 5 each side               PT Short Term Goals - 02/09/18 0843      PT SHORT TERM GOAL #1   Title  patient to demo improved left hip flexion to 100 degrees to improve function    Baseline  --    Time  4    Period  Weeks    Status  Achieved  PT SHORT TERM GOAL #2   Title  independent and compliant with HEP for flexibility    Time  4    Period  Weeks    Status  Achieved        PT Long Term Goals - 02/18/18 1217      PT LONG TERM GOAL #1   Title  Patient able to perform ADLs in her garden with normal flexiblity    Time  8    Period  Weeks    Status  Achieved      PT LONG TERM GOAL #2   Title  Patient to demo improved flexibility in L hip to [facility] to perform ADLS.    Time  8    Period  Weeks    Status  Achieved      PT LONG TERM GOAL #3   Title  Patient to demo good functional strength with squats to normalize ADLS.    Time  8    Period  Weeks    Status  Achieved      PT LONG TERM GOAL #4   Title  Patient to report decreased fatigue by end of day by 75% or more    Time  8    Period  Weeks    Status  Partially Met      PT LONG TERM GOAL #5   Title  Patient to be able to perform higher level SLS activities without LOB (specifically dance positions)    Time  2    Period  Weeks    Status  On-going            Plan - 02/18/18 0845    Clinical Impression Statement  Patient did very well with balance activities today. She is compliant with with HEP.     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  D/C to HEP    PT Home Exercise Plan  HS stretch, prone press ups for hip flexors, SKTC, supine butterfly, stdy hip add stretch, squat on toes for plantar stretch, isometric heel raise       Patient will benefit from skilled therapeutic intervention in order to improve the following deficits and impairments:  Decreased range of motion, Decreased activity tolerance, Decreased strength, Impaired flexibility  Visit Diagnosis: Muscle weakness (generalized)  Stiffness of left hip, not elsewhere classified  Pain in right ankle and joints of right foot     Problem List Patient Active Problem List   Diagnosis Date Noted  . Status post total replacement of left hip 10/10/2017  . Pain in left hip 06/26/2017  . Unilateral primary osteoarthritis, left hip 06/26/2017    Herchel Hopkin PT 02/18/2018, 12:19 PM  [place] [address] [address] [place], [place], [address] Phone: [phone]   Fax:  [phone]  Name: BETTYLOU FREW MRN: [id number] Date of Birth: 04/07/1956

## 2018-02-23 ENCOUNTER — Ambulatory Visit: Payer: BC Managed Care – PPO | Admitting: Physical Therapy

## 2018-02-23 ENCOUNTER — Encounter: Payer: Self-pay | Admitting: Physical Therapy

## 2018-02-23 DIAGNOSIS — M25571 Pain in right ankle and joints of right foot: Secondary | ICD-10-CM

## 2018-02-23 DIAGNOSIS — M6281 Muscle weakness (generalized): Secondary | ICD-10-CM

## 2018-02-23 DIAGNOSIS — M25652 Stiffness of left hip, not elsewhere classified: Secondary | ICD-10-CM

## 2018-02-23 NOTE — Therapy (Signed)
[place], [place], [address] Phone: [phone]   Fax:  [phone]  Physical Therapy Treatment  Patient Details  Name: Sabrina Singleton MRN: [id number] Date of Birth: [birth year] Referring Provider: Ninfa Linden   Encounter Date: 02/23/2018  PT End of Session - 02/23/18 0844    Visit Number  9    Date for PT Re-Evaluation  03/02/18    PT Start Time  0804    PT Stop Time  0845    PT Time Calculation (min)  41 min    Activity Tolerance  Patient tolerated treatment well    Behavior During Therapy  [facility] for tasks assessed/performed       Past Medical History:  Diagnosis Date  . Arthritis   . Complication of anesthesia    as a very young child eye surgery  atropine  was given with anesthesia and pt. stopped breathing   . GERD (gastroesophageal reflux disease)    hx of 15 years ago no meds changed diet    Past Surgical History:  Procedure Laterality Date  . CESAREAN SECTION    . EYE SURGERY     as a child  . TONSILLECTOMY    . TOTAL HIP ARTHROPLASTY Left 10/10/2017   Procedure: LEFT TOTAL HIP ARTHROPLASTY ANTERIOR APPROACH;  Surgeon: Mcarthur Rossetti, MD;  [facility];  Service: Orthopedics;  Laterality: Left;    There were no vitals filed for this visit.  Subjective Assessment - 02/23/18 0806    Subjective  "Doing fine" R achilles is still stiff, not a lot of pain     Currently in Pain?  No/denies    Pain Score  0-No pain                       [facility] Adult PT Treatment/Exercise - 02/23/18 0001      Exercises   Exercises  Knee/Hip      Knee/Hip Exercises: Aerobic   Elliptical  Incline 5, R 2 x 4 min    Nustep  L3 4 min      Knee/Hip Exercises: Machines for Strengthening   Cybex Leg Press  40lb. 2x15      Knee/Hip Exercises: Standing   Hip Abduction  Both;1 set;10 reps;Knee straight    Abduction Limitations  5    Hip Extension  Both;1 set;10 reps;Knee  straight    Extension Limitations  5lb    Lateral Step Up  Both;2 sets;10 reps;Hand Hold: 0    Functional Squat  1 set;10 reps    Lunge Walking - Round Trips  1 walking: in place x 16 left side (fatigue); R side to fatigue    Walking with Sports Cord  Side stepping both ways 5 ea way 30 lb.    Other Standing Knee Exercises  SL DL 3lb to 8in box x10 each    Other Standing Knee Exercises  8 in step ups 10lb x5, 20 lb x5               PT Short Term Goals - 02/09/18 9211      PT SHORT TERM GOAL #1   Title  patient to demo improved left hip flexion to 100 degrees to improve function    Baseline  --    Time  4    Period  Weeks    Status  Achieved      PT SHORT TERM GOAL #2  Title  independent and compliant with HEP for flexibility    Time  4    Period  Weeks    Status  Achieved        PT Long Term Goals - 02/18/18 1217      PT LONG TERM GOAL #1   Title  Patient able to perform ADLs in her garden with normal flexiblity    Time  8    Period  Weeks    Status  Achieved      PT LONG TERM GOAL #2   Title  Patient to demo improved flexibility in L hip to [facility] to perform ADLS.    Time  8    Period  Weeks    Status  Achieved      PT LONG TERM GOAL #3   Title  Patient to demo good functional strength with squats to normalize ADLS.    Time  8    Period  Weeks    Status  Achieved      PT LONG TERM GOAL #4   Title  Patient to report decreased fatigue by end of day by 75% or more    Time  8    Period  Weeks    Status  Partially Met      PT LONG TERM GOAL #5   Title  Patient to be able to perform higher level SLS activities without LOB (specifically dance positions)    Time  2    Period  Weeks    Status  On-going            Plan - 02/23/18 0845    Clinical Impression Statement  Pt continues to be compliant with HEP. L hip weakness noted with SL dead lifts. Cue needed to go lower into forward lunge.     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  D/C to HEP       Patient will benefit from skilled therapeutic intervention in order to improve the following deficits and impairments:  Decreased range of motion, Decreased activity tolerance, Decreased strength, Impaired flexibility  Visit Diagnosis: Muscle weakness (generalized)  Stiffness of left hip, not elsewhere classified  Pain in right ankle and joints of right foot     Problem List Patient Active Problem List   Diagnosis Date Noted  . Status post total replacement of left hip 10/10/2017  . Pain in left hip 06/26/2017  . Unilateral primary osteoarthritis, left hip 06/26/2017    Scot Jun, PTA 02/23/2018, 8:51 AM  [place], [place], [address] Phone: [phone]   Fax:  [phone]  Name: Sabrina Singleton MRN: [id number] Date of Birth: 1955/11/09

## 2018-03-03 ENCOUNTER — Encounter: Payer: Self-pay | Admitting: Physical Therapy

## 2018-03-03 ENCOUNTER — Ambulatory Visit: Payer: BC Managed Care – PPO | Admitting: Physical Therapy

## 2018-03-03 DIAGNOSIS — M25652 Stiffness of left hip, not elsewhere classified: Secondary | ICD-10-CM

## 2018-03-03 DIAGNOSIS — M25571 Pain in right ankle and joints of right foot: Secondary | ICD-10-CM

## 2018-03-03 DIAGNOSIS — M6281 Muscle weakness (generalized): Secondary | ICD-10-CM | POA: Diagnosis not present

## 2018-03-03 NOTE — Therapy (Signed)
[place], [place], [address] Phone: [phone]   Fax:  [phone]  Physical Therapy Treatment  Patient Details  Name: Sabrina Singleton MRN: [id number] Date of Birth: 03-May-1956 Referring Provider: Ninfa Linden   Encounter Date: 03/03/2018  PT End of Session - 03/03/18 0838    Visit Number  10    Date for PT Re-Evaluation  03/02/18    PT Start Time  0800    PT Stop Time  0840    PT Time Calculation (min)  40 min    Activity Tolerance  Patient tolerated treatment well    Behavior During Therapy  [facility] for tasks assessed/performed       Past Medical History:  Diagnosis Date  . Arthritis   . Complication of anesthesia    as a very young child eye surgery  atropine  was given with anesthesia and pt. stopped breathing   . GERD (gastroesophageal reflux disease)    hx of 15 years ago no meds changed diet    Past Surgical History:  Procedure Laterality Date  . CESAREAN SECTION    . EYE SURGERY     as a child  . TONSILLECTOMY    . TOTAL HIP ARTHROPLASTY Left 10/10/2017   Procedure: LEFT TOTAL HIP ARTHROPLASTY ANTERIOR APPROACH;  Surgeon: Mcarthur Rossetti, MD;  Location: [facility];  Service: Orthopedics;  Laterality: Left;    There were no vitals filed for this visit.  Subjective Assessment - 03/03/18 0802    Subjective  "A little run down from doing too much" Reports that her L hip feels fatigue at night but no pain    Currently in Pain?  Yes    Pain Score  3     Pain Location  Ankle    Pain Orientation  Posterior                       [facility] Adult PT Treatment/Exercise - 03/03/18 0001      Ambulation/Gait   Ambulation/Gait  Yes    Stairs  Yes    Gait Comments  one flight then back down outside and up hill IND      Knee/Hip Exercises: Aerobic   Elliptical  Incline 5, R 3 x 4 min    Recumbent Bike  L1 x 4 min      Knee/Hip Exercises: Machines for Strengthening    Cybex Knee Extension  15lb. 2x10    Cybex Knee Flexion  25 lb. 2x10    Cybex Leg Press  30lb. 2x15      Knee/Hip Exercises: Standing   Forward Step Up  Both;1 set;10 reps;Hand Hold: 0;Step Height: 8"    Walking with Sports Cord  Side stepping both ways 5 ea way 40 lb.    Other Standing Knee Exercises  sit to stand with yellow ball from blue chair 2x10       Ankle Exercises: Stretches   Gastroc Stretch  3 reps;10 seconds               PT Short Term Goals - 02/09/18 0843      PT SHORT TERM GOAL #1   Title  patient to demo improved left hip flexion to 100 degrees to improve function    Baseline  --    Time  4    Period  Weeks    Status  Achieved      PT SHORT TERM  GOAL #2   Title  independent and compliant with HEP for flexibility    Time  4    Period  Weeks    Status  Achieved        PT Long Term Goals - 03/03/18 7209      PT LONG TERM GOAL #4   Title  Patient to report decreased fatigue by end of day by 75% or more    Status  Achieved      PT LONG TERM GOAL #5   Title  Patient to be able to perform higher level SLS activities without LOB (specifically dance positions)    Status  Achieved            Plan - 03/03/18 0841    Clinical Impression Statement  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 No issues with today's activities. All goals met    Rehab Potential  Excellent    PT Frequency  2x / week    PT Duration  8 weeks    PT Treatment/Interventions  ADLs/Self Care Home Management;Moist Heat;Electrical Stimulation;Therapeutic exercise;Therapeutic activities;Stair training;Patient/family education;Manual techniques;Passive range of motion    PT Next Visit Plan  D/C PT       Patient will benefit from skilled therapeutic intervention in order to improve the following deficits and impairments:  Decreased range of motion, Decreased activity tolerance, Decreased strength, Impaired flexibility  Visit Diagnosis: Muscle weakness  (generalized)  Stiffness of left hip, not elsewhere classified  Pain in right ankle and joints of right foot     Problem List Patient Active Problem List   Diagnosis Date Noted  . Status post total replacement of left hip 10/10/2017  . Pain in left hip 06/26/2017  . Unilateral primary osteoarthritis, left hip 06/26/2017   PHYSICAL THERAPY DISCHARGE SUMMARY  Visits from Start of Care: 10  Plan: Patient agrees to discharge.  Patient goals were met. Patient is being discharged due to meeting the stated rehab goals.  ?????       Scot Jun, PTA 03/03/2018, 8:45 AM  [place] [address] [place], [place], [address] Phone: [phone]   Fax:  [phone]  Name: Sabrina Singleton MRN: [id number] Date of Birth: 1956-03-14

## 2018-08-04 ENCOUNTER — Ambulatory Visit (INDEPENDENT_AMBULATORY_CARE_PROVIDER_SITE_OTHER): Payer: BC Managed Care – PPO | Admitting: Orthopaedic Surgery

## 2018-08-17 ENCOUNTER — Encounter (INDEPENDENT_AMBULATORY_CARE_PROVIDER_SITE_OTHER): Payer: Self-pay | Admitting: Orthopaedic Surgery

## 2018-08-17 ENCOUNTER — Other Ambulatory Visit (INDEPENDENT_AMBULATORY_CARE_PROVIDER_SITE_OTHER): Payer: Self-pay

## 2018-08-17 ENCOUNTER — Ambulatory Visit (INDEPENDENT_AMBULATORY_CARE_PROVIDER_SITE_OTHER): Payer: BC Managed Care – PPO | Admitting: Orthopaedic Surgery

## 2018-08-17 ENCOUNTER — Ambulatory Visit (INDEPENDENT_AMBULATORY_CARE_PROVIDER_SITE_OTHER): Payer: BC Managed Care – PPO

## 2018-08-17 DIAGNOSIS — M25551 Pain in right hip: Secondary | ICD-10-CM | POA: Diagnosis not present

## 2018-08-17 DIAGNOSIS — Z96642 Presence of left artificial hip joint: Secondary | ICD-10-CM

## 2018-08-17 NOTE — Progress Notes (Signed)
Office Visit Note   Patient: Sabrina Singleton           Date of Birth: [birth year]           MRN: [id number] Visit Date: 08/17/2018              Requested by: Deatra James, MD [phone] [address] [place], [place] [address] PCP: Deatra James, MD   Assessment & Plan: Visit Diagnoses:  1. Status post total replacement of left hip   2. Pain of right hip joint     Plan: Given the rapid progression of her hip disease on the left side that led to her hip replacement and given the fact that now she is experiencing severe right hip pain an MRI of her right hip is warranted to assess the cartilage in the muscles and tendons in this area.  I do feel this is now reasonable medically warranted.  She is tried failed conservative treatment as well.  All question concerns were answered and addressed.  We will order the MRI without contrast to assess the right hip cartilage and tendons and muscles.  We will see her back in follow-up after the MRI.  Follow-Up Instructions: Return in about 3 weeks (around 09/07/2018).   Orders:  Orders Placed This Encounter  Procedures  . XR HIP UNILAT W OR W/O PELVIS 2-3 VIEWS LEFT   No orders of the defined types were placed in this encounter.     Procedures: No procedures performed   Clinical Data: No additional findings.   Subjective: Chief Complaint  Patient presents with  . Left Hip - Follow-up  The patient is well-known to me.  She is had a left total hip arthroplasty 10 months ago.  She said the hip is doing well.  I been following her with some chronic right Achilles tendon issues.  She is had a recent fall injuring her right ankle and now her right hip is hurting globally around the hip joint and in the groin.  Previous x-rays did not show hip disease on her right hip.  Actually fuse ago her left hip appeared normal on x-ray but the knee got profoundly worse over a year to a year and a half and x-rays eventually showed severe end-stage arthritis.   She is concerned about her right hip at this point.  It is affecting how she walks.  Her left hip has no issues at all.  That was the operative hip.  She denies any acute changes in her medical status either.  HPI  Review of Systems She currently denies any headache, chest pain, shortness of breath, fever, chills, nausea, vomiting  Objective: Vital Signs: There were no vitals taken for this visit.  Physical Exam She is alert and orient x3 and in no acute distress Ortho Exam Examination of her left hip shows that moves smoothly and fluidly with no pain at all.  Examination of her right hip shows pain with internal and external rotation on the groin and the hip muscles as well.  Examination of her right Achilles still shows swelling of the Achilles but [name] test is negative. Specialty Comments:  No specialty comments available.  Imaging: Xr Hip Unilat W Or W/o Pelvis 2-3 Views Left  Result Date: 08/17/2018 An AP pelvis and lateral left hip shows normal-appearing hip replacement on the left side with no complicating features or evidence of loosening.  On the AP view the right hip can be seen and it  shows a normal ball-and-socket with a congruent joint space.    PMFS History: Patient Active Problem List   Diagnosis Date Noted  . Status post total replacement of left hip 10/10/2017  . Pain in left hip 06/26/2017  . Unilateral primary osteoarthritis, left hip 06/26/2017   Past Medical History:  Diagnosis Date  . Arthritis   . Complication of anesthesia    as a very young child eye surgery  atropine  was given with anesthesia and pt. stopped breathing   . GERD (gastroesophageal reflux disease)    hx of 15 years ago no meds changed diet    History reviewed. No pertinent family history.  Past Surgical History:  Procedure Laterality Date  . CESAREAN SECTION    . EYE SURGERY     as a child  . TONSILLECTOMY    . TOTAL HIP ARTHROPLASTY Left 10/10/2017   Procedure: LEFT TOTAL  HIP ARTHROPLASTY ANTERIOR APPROACH;  Surgeon: Kathryne Hitch, MD;  Location: [facility];  Service: Orthopedics;  Laterality: Left;   Social History   Occupational History  . Not on file  Tobacco Use  . Smoking status: Never Smoker  . Smokeless tobacco: Never Used  Substance and Sexual Activity  . Alcohol use: Yes    Comment: twice a week  . Drug use: Never  . Sexual activity: Yes

## 2018-08-31 ENCOUNTER — Ambulatory Visit
Admission: RE | Admit: 2018-08-31 | Discharge: 2018-08-31 | Disposition: A | Discharge status: Home / Self Care | Payer: BC Managed Care – PPO | Source: Ambulatory Visit | Attending: Orthopaedic Surgery | Admitting: Orthopaedic Surgery

## 2018-08-31 DIAGNOSIS — M25551 Pain in right hip: Secondary | ICD-10-CM

## 2018-09-07 ENCOUNTER — Encounter (INDEPENDENT_AMBULATORY_CARE_PROVIDER_SITE_OTHER): Payer: Self-pay | Admitting: Orthopaedic Surgery

## 2018-09-07 ENCOUNTER — Ambulatory Visit (INDEPENDENT_AMBULATORY_CARE_PROVIDER_SITE_OTHER): Payer: BC Managed Care – PPO | Admitting: Orthopaedic Surgery

## 2018-09-07 ENCOUNTER — Other Ambulatory Visit: Payer: Self-pay

## 2018-09-07 DIAGNOSIS — W19XXXA Unspecified fall, initial encounter: Secondary | ICD-10-CM

## 2018-09-07 DIAGNOSIS — S72044A Nondisplaced fracture of base of neck of right femur, initial encounter for closed fracture: Secondary | ICD-10-CM

## 2018-09-07 DIAGNOSIS — M1611 Unilateral primary osteoarthritis, right hip: Secondary | ICD-10-CM | POA: Diagnosis not present

## 2018-09-07 NOTE — Progress Notes (Signed)
The patient is following up today after having an MRI of her right hip.  She had a mechanical fall just a month ago landing on that right hip very hard.  She was having pain in her sacrum in her hip itself.  She is ambulating without any type of assistive device.  She is only 63 years old.  She is 11 months out from a left total hip arthroplasty.  She does report some pain more in her sacral area and less in the right groin area.  On exam, I can put her right hip through internal X rotation with only minimal discomfort.  She has pain to palpation over the issue him and pubis area but she does have pain over the sacral area as well.  The MRI is reviewed with her and it does show an incomplete fracture of the femoral neck on the right side with associated edema.  There is also sacral insufficiency fracture.  There is degenerative changes of the right hip in terms of cystic changes in the acetabulum and degenerative labral tear.  I went over her MRI with her in detail and explained that she needs to offload her right hip is much as possible with a cane in her opposite hand.  She is someone that is borderline in need of a hip replacement surgery that would be more urgent than not given that she has a femoral neck fracture.  She last had a bone density exam 8 years ago and is not on any medications other than the supplements.  I do feel that she should see her primary care physician soon to consider a repeat bone density study to see if she is developed osteopenia or osteoporosis.  I would like to see her back in 4 weeks with a repeat AP and lateral of the right hip.  I do not need to see the pelvis, but just to the right hip.  If her pain worsens anyway I would lobby to have her set up for a right total hip arthroplasty which would be more urgent.  She understands the need to hold out from any high impact aerobic activities no exercising at all until to make sure this heals appropriately.  We will likely repeat an  MRI in about 3 months of her right hip.  All question concerns were answered and addressed.

## 2018-10-05 ENCOUNTER — Encounter (INDEPENDENT_AMBULATORY_CARE_PROVIDER_SITE_OTHER): Payer: Self-pay | Admitting: Orthopaedic Surgery

## 2018-10-05 ENCOUNTER — Ambulatory Visit (INDEPENDENT_AMBULATORY_CARE_PROVIDER_SITE_OTHER): Payer: BC Managed Care – PPO | Admitting: Orthopaedic Surgery

## 2018-10-05 ENCOUNTER — Other Ambulatory Visit: Payer: Self-pay

## 2018-10-05 ENCOUNTER — Ambulatory Visit (INDEPENDENT_AMBULATORY_CARE_PROVIDER_SITE_OTHER): Payer: BC Managed Care – PPO

## 2018-10-05 DIAGNOSIS — S72044D Nondisplaced fracture of base of neck of right femur, subsequent encounter for closed fracture with routine healing: Secondary | ICD-10-CM | POA: Diagnosis not present

## 2018-10-05 NOTE — Progress Notes (Signed)
The patient is a very pleasant 63 year old female who is now 2 months into a mechanical fall landing on her right hip.  After continued hip pain and normal x-rays and MRI was obtained last month.  It did show a stress fracture of the femoral neck.  We have been treating this with activity modification and offloading the hip on the right side.  At this point she is walking without any assistive device and has minimal hip pain.  She is held out of any high impact activities as it relates to her right hip.  On exam I can put her hip to internal and external rotation and she hurts only on extremes of rotation and that is very minimal.  She walks without a limp.  Compressing the hip on the right side causes no pain.  New x-rays of her right hip are obtained in showed no gross deformities.  The hip ball shows no evidence of osteonecrosis and there is a small sclerotic line that can be seen that correlates with her MRI stress fracture line.  I gave her reassurance that she should do well with time but should hold off still from high impact aerobic activities.  She can ease into these over the next 4 to 6 weeks.  I would like to see her back in 3 months with a repeat AP and lateral of the right hip.  If there is any issues before then she will let us know.  All question concerns were answered and addressed.

## 2019-01-04 ENCOUNTER — Ambulatory Visit: Payer: Self-pay

## 2019-01-04 ENCOUNTER — Ambulatory Visit (INDEPENDENT_AMBULATORY_CARE_PROVIDER_SITE_OTHER): Payer: BC Managed Care – PPO | Admitting: Orthopaedic Surgery

## 2019-01-04 ENCOUNTER — Encounter: Payer: Self-pay | Admitting: Orthopaedic Surgery

## 2019-01-04 VITALS — Ht 65.0 in | Wt 155.0 lb

## 2019-01-04 DIAGNOSIS — Z96642 Presence of left artificial hip joint: Secondary | ICD-10-CM | POA: Diagnosis not present

## 2019-01-04 DIAGNOSIS — M84351A Stress fracture, right femur, initial encounter for fracture: Secondary | ICD-10-CM

## 2019-01-04 DIAGNOSIS — S72044D Nondisplaced fracture of base of neck of right femur, subsequent encounter for closed fracture with routine healing: Secondary | ICD-10-CM | POA: Diagnosis not present

## 2019-01-04 NOTE — Progress Notes (Signed)
The patient is being seen in follow-up 5 months after sustaining a stress fracture to her right hip femoral neck.  This was treated nonoperatively.  She says now she only has problems if she overdoes things with her hip but otherwise is doing well.  On examination of her right hip I can easily put her through internal and external rotation and compression of the hip with no pain at all.  X-rays of the pelvis and right hip show no evidence of fracture.  There is no evidence of osteonecrosis of the femoral head.  At this point follow-up can be as needed.  However if she develops any worsening right hip pain I would want to obtain a new MRI of her right hip.  She will still avoid high impact of aerobic activities for at least another 4 weeks.  All question concerns were answered and addressed.  Of note she does have a history of a left total hip arthroplasty that we did remotely.  She has no issues with that hip and I do not see any issues with the left hip on the AP pelvis view.

## 2019-06-29 ENCOUNTER — Other Ambulatory Visit: Payer: Self-pay | Admitting: Family Medicine

## 2019-06-29 DIAGNOSIS — E2839 Other primary ovarian failure: Secondary | ICD-10-CM

## 2019-09-08 ENCOUNTER — Other Ambulatory Visit: Payer: Self-pay

## 2019-09-08 ENCOUNTER — Ambulatory Visit
Admission: RE | Admit: 2019-09-08 | Discharge: 2019-09-08 | Disposition: A | Discharge status: Home / Self Care | Payer: BC Managed Care – PPO | Source: Ambulatory Visit | Attending: Family Medicine | Admitting: Family Medicine

## 2019-09-08 DIAGNOSIS — E2839 Other primary ovarian failure: Secondary | ICD-10-CM

## 2020-11-26 IMAGING — MR MRI OF THE RIGHT HIP WITHOUT CONTRAST
4 of 5 series · 28 of 40 positions shown · non-contrast
Comparison: Plain films left hip 08/17/2018 from [REDACTED].

CLINICAL DATA: Right hip pain and weakness since a fall in
July 2018. History of prior left hip replacement. Initial
encounter.

EXAM:
MR OF THE RIGHT HIP WITHOUT CONTRAST
TECHNIQUE: Multiplanar, multisequence MR imaging was performed. No intravenous
contrast was administered.

[Series 8: T2 fat-sat · coronal · right · 3.0mm · 0.78mm/px · 8 of 30 slices shown (1 of 2)]
[im 1/30]
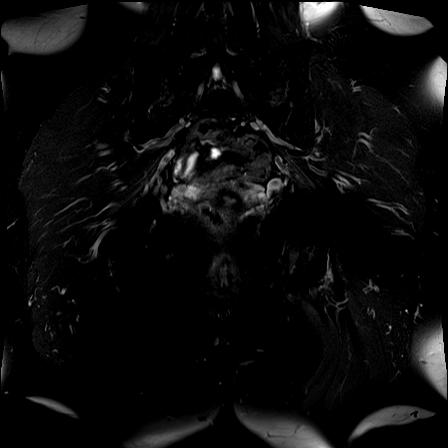
[im 5/30]
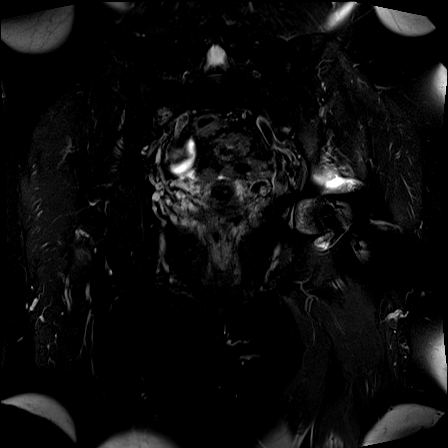
[im 9/30]
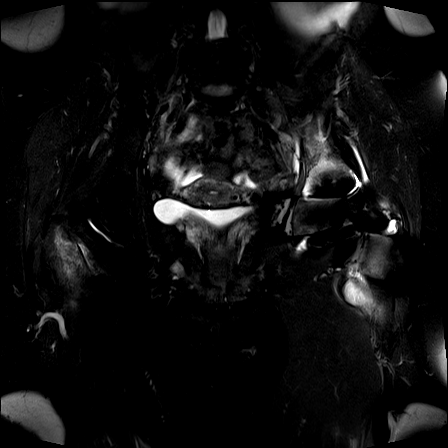
[im 13/30]
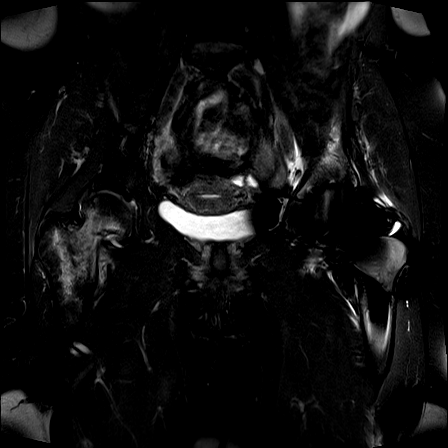
[im 17/30]
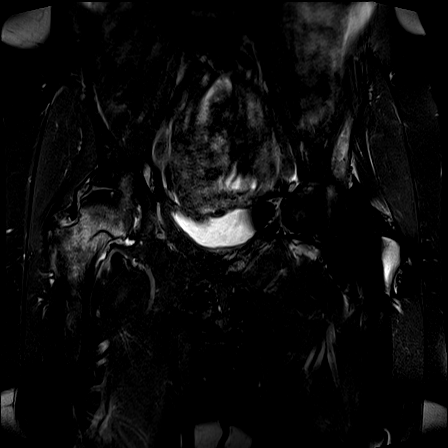
[im 21/30]
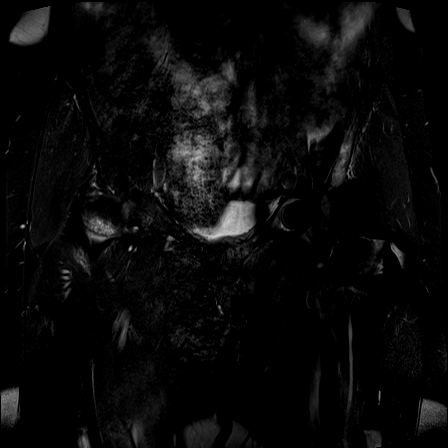
[im 25/30]
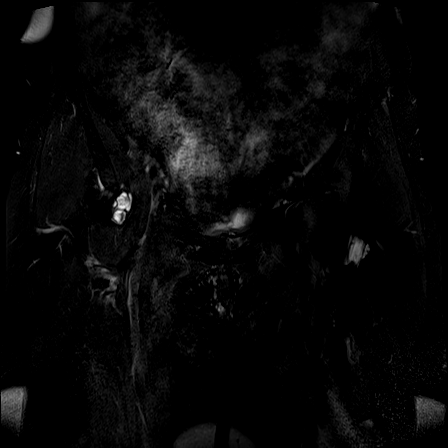
[im 30/30]
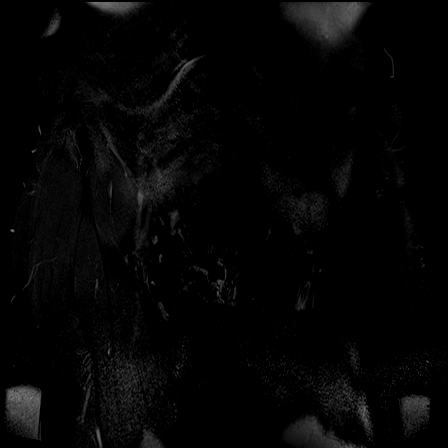

[Series 10: T2 fat-sat · axial · right · 3.0mm · 0.28mm/px · z∈[-113,-27]mm · 4 of 29 slices shown (2 of 2)]
[im 1/29]
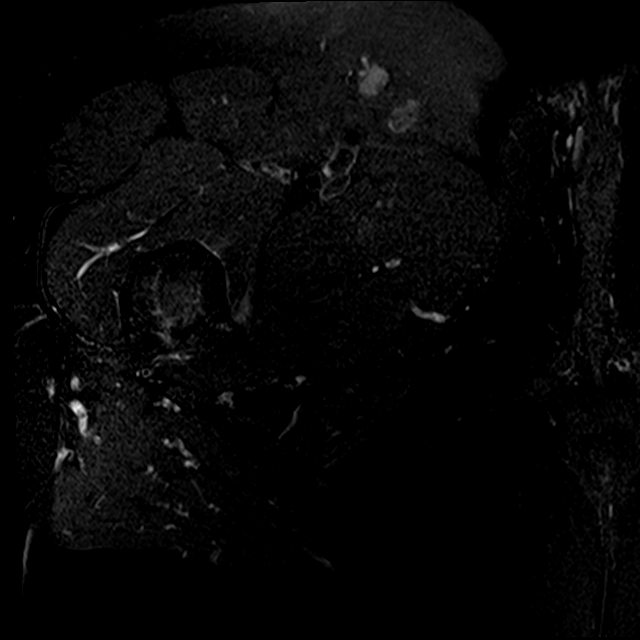
[im 5/29]
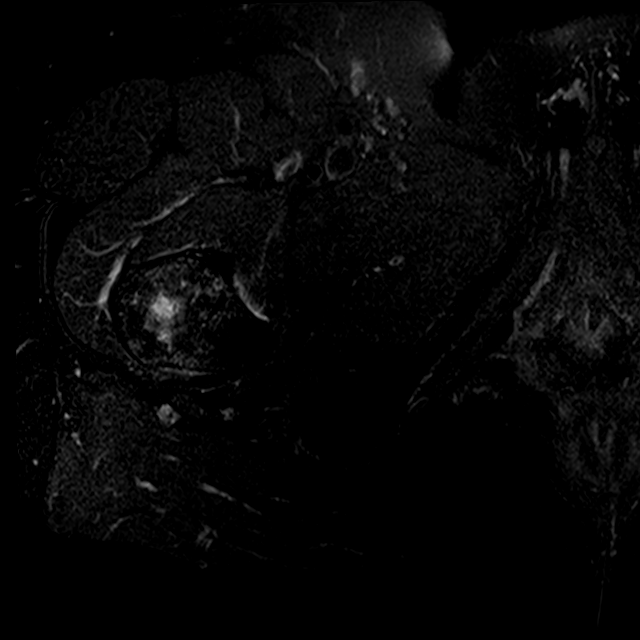
[im 17/29]
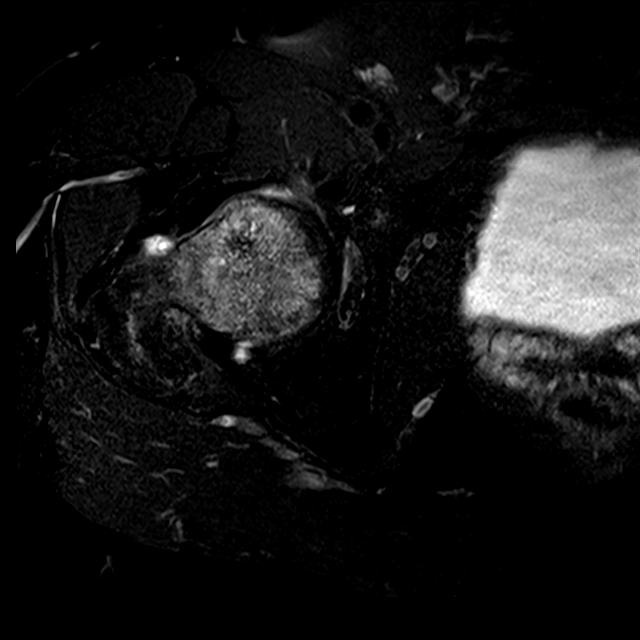
[im 25/29]
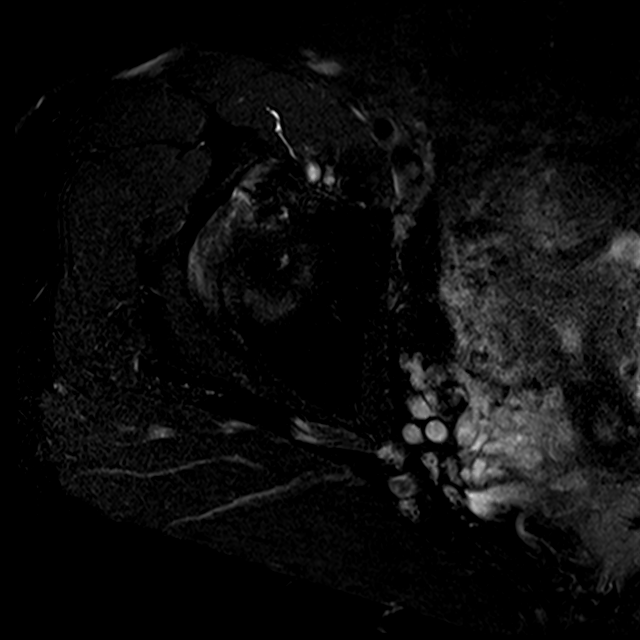

[Series 11: PD fat-sat · coronal · right · 3.0mm · 0.56mm/px · 8 of 28 slices shown (1 of 2)]
[im 1/28]
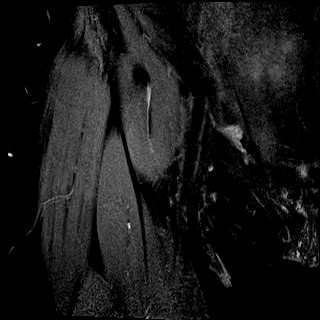
[im 4/28]
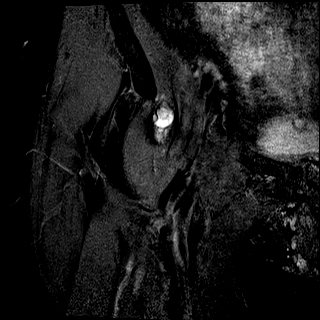
[im 8/28]
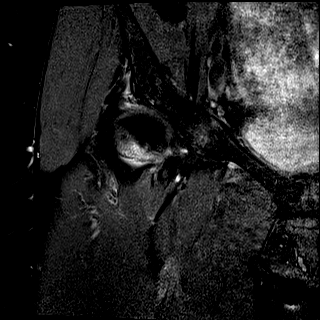
[im 12/28]
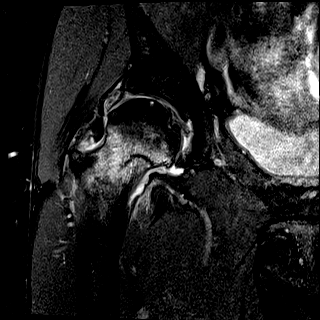
[im 16/28]
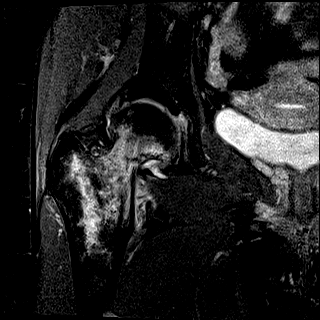
[im 20/28]
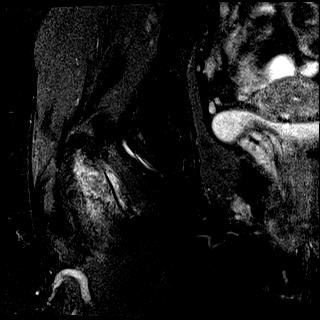
[im 24/28]
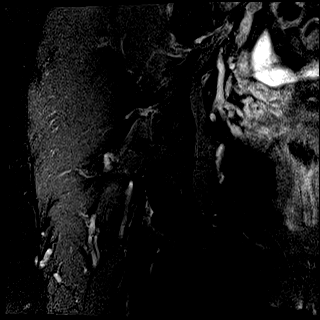
[im 28/28]
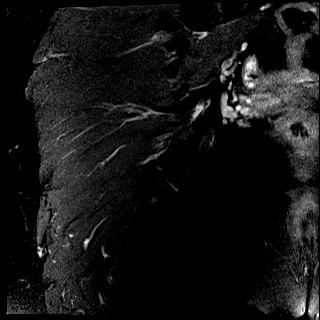

[Series 12: PD fat-sat · sagittal · right · 3.0mm · 0.56mm/px · 8 of 28 slices shown (2 of 2)]
[im 1/28]
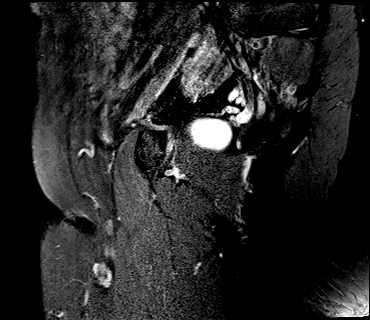
[im 4/28]
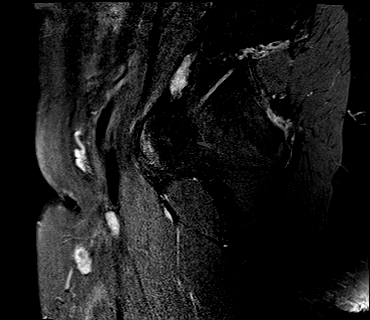
[im 8/28]
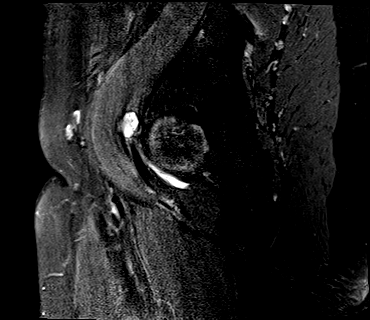
[im 12/28]
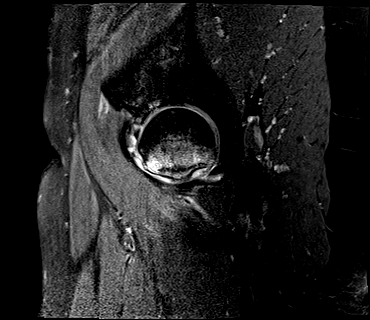
[im 16/28]
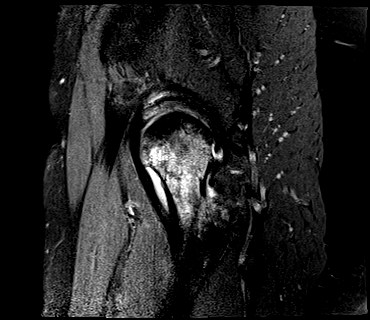
[im 20/28]
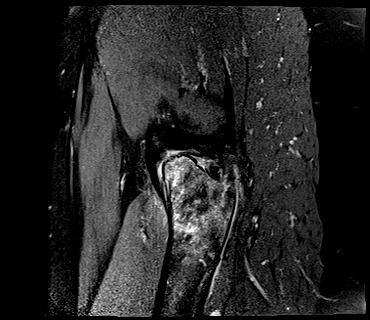
[im 24/28]
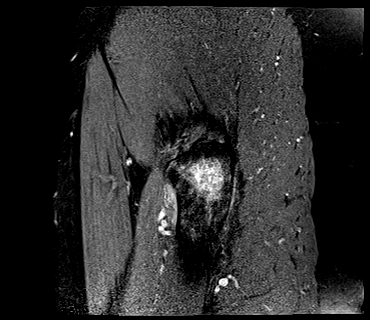
[im 28/28]
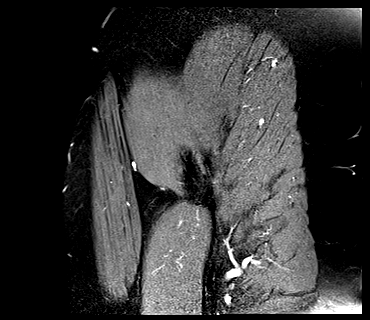

[28 of 40 positions shown; findings below may reference images not displayed]

FINDINGS: Bones: There is marrow edema in the right femoral neck due to an
incomplete fracture through the medial aspect of the neck. The
fracture is nondisplaced. The patient also has a small nondisplaced
fracture of the right sacrum with associated marrow edema. Minimal
subchondral edema in the inferior aspect of the left sacrum at the
SI joint is most consistent with degenerative disease. Nondisplaced
left parasymphyseal pubic bone fracture with associated marrow edema
is present. Left hip replacement noted. No worrisome marrow lesion.
There is some subchondral cyst formation in the anterior aspect of
the right acetabulum and mild edema in the acetabulum.

Articular cartilage and labrum

Articular cartilage: Thinned and irregular, worst along the
anterior, superior aspect of the acetabulum.

Labrum: Degenerative tearing of the anterior and superior labrum is
present. There is a paralabral cyst anteriorly measuring 2.5 cm
craniocaudal x 0.9 cm AP x 1.2 cm transverse.

Joint or bursal effusion

Joint effusion:  None.

Bursae: Negative.

Muscles and tendons

Muscles and tendons:  Intact.

Other findings

Miscellaneous: Imaged intrapelvic contents demonstrate no acute
abnormality.
IMPRESSION: Findings consistent with an acute or subacute nondisplaced and
incomplete fracture of the right femoral neck.

Acute or subacute nondisplaced fractures of the left parasymphyseal
pubic bone and right sacrum.

Moderate to moderately severe right hip osteoarthritis with
associated degenerative tearing of the anterior and superior right
labrum. Associated paralabral cyst formation anteriorly is
identified.

## 2021-07-04 ENCOUNTER — Telehealth: Payer: Self-pay | Admitting: Orthopaedic Surgery

## 2021-07-04 NOTE — Telephone Encounter (Signed)
Emailed

## 2021-07-04 NOTE — Telephone Encounter (Signed)
Pt called stating she had surgery 3 years ago and has a dentist appt coming up. Pt would like a letter emailed to her stating Dr.Blackman only requires antibiotics 3 months post op for the dentist records.   [email or web address]

## 2021-07-04 NOTE — Telephone Encounter (Signed)
Letter completed. Can you email it for me?

## 2021-12-27 ENCOUNTER — Other Ambulatory Visit: Payer: Self-pay | Admitting: Family Medicine

## 2021-12-27 DIAGNOSIS — Z13820 Encounter for screening for osteoporosis: Secondary | ICD-10-CM

## 2022-06-20 ENCOUNTER — Ambulatory Visit
Admission: RE | Admit: 2022-06-20 | Discharge: 2022-06-20 | Disposition: A | Discharge status: Home / Self Care | Payer: BC Managed Care – PPO | Source: Ambulatory Visit | Attending: Family Medicine | Admitting: Family Medicine

## 2022-06-20 DIAGNOSIS — Z13820 Encounter for screening for osteoporosis: Secondary | ICD-10-CM

## 2022-08-23 ENCOUNTER — Ambulatory Visit
Admission: RE | Admit: 2022-08-23 | Discharge: 2022-08-23 | Disposition: A | Discharge status: Home / Self Care | Payer: BC Managed Care – PPO | Source: Ambulatory Visit | Attending: Family Medicine | Admitting: Family Medicine

## 2022-08-23 ENCOUNTER — Other Ambulatory Visit: Payer: Self-pay | Admitting: Family Medicine

## 2022-08-23 DIAGNOSIS — R053 Chronic cough: Secondary | ICD-10-CM

## 2023-01-21 DIAGNOSIS — Z0000 Encounter for general adult medical examination without abnormal findings: Secondary | ICD-10-CM | POA: Diagnosis not present

## 2023-01-21 DIAGNOSIS — M810 Age-related osteoporosis without current pathological fracture: Secondary | ICD-10-CM | POA: Diagnosis not present

## 2023-01-21 DIAGNOSIS — Z136 Encounter for screening for cardiovascular disorders: Secondary | ICD-10-CM | POA: Diagnosis not present

## 2023-02-09 DIAGNOSIS — N390 Urinary tract infection, site not specified: Secondary | ICD-10-CM | POA: Diagnosis not present

## 2023-03-03 DIAGNOSIS — N390 Urinary tract infection, site not specified: Secondary | ICD-10-CM | POA: Diagnosis not present

## 2023-03-03 DIAGNOSIS — R102 Pelvic and perineal pain: Secondary | ICD-10-CM | POA: Diagnosis not present

## 2023-03-26 DIAGNOSIS — Z85828 Personal history of other malignant neoplasm of skin: Secondary | ICD-10-CM | POA: Diagnosis not present

## 2023-03-26 DIAGNOSIS — Z85820 Personal history of malignant melanoma of skin: Secondary | ICD-10-CM | POA: Diagnosis not present

## 2023-03-26 DIAGNOSIS — D225 Melanocytic nevi of trunk: Secondary | ICD-10-CM | POA: Diagnosis not present

## 2023-03-26 DIAGNOSIS — L821 Other seborrheic keratosis: Secondary | ICD-10-CM | POA: Diagnosis not present

## 2023-06-24 DIAGNOSIS — Z800 Family history of malignant neoplasm of digestive organs: Secondary | ICD-10-CM | POA: Diagnosis not present

## 2023-06-24 DIAGNOSIS — Z860100 Personal history of colon polyps, unspecified: Secondary | ICD-10-CM | POA: Diagnosis not present

## 2023-06-24 DIAGNOSIS — K219 Gastro-esophageal reflux disease without esophagitis: Secondary | ICD-10-CM | POA: Diagnosis not present

## 2023-07-30 DIAGNOSIS — K449 Diaphragmatic hernia without obstruction or gangrene: Secondary | ICD-10-CM | POA: Diagnosis not present

## 2023-07-30 DIAGNOSIS — K219 Gastro-esophageal reflux disease without esophagitis: Secondary | ICD-10-CM | POA: Diagnosis not present

## 2023-07-30 DIAGNOSIS — K3189 Other diseases of stomach and duodenum: Secondary | ICD-10-CM | POA: Diagnosis not present

## 2023-07-30 DIAGNOSIS — K2950 Unspecified chronic gastritis without bleeding: Secondary | ICD-10-CM | POA: Diagnosis not present

## 2023-08-20 DIAGNOSIS — K219 Gastro-esophageal reflux disease without esophagitis: Secondary | ICD-10-CM | POA: Diagnosis not present

## 2023-09-23 DIAGNOSIS — K080 Exfoliation of teeth due to systemic causes: Secondary | ICD-10-CM | POA: Diagnosis not present

## 2023-10-07 DIAGNOSIS — Z1231 Encounter for screening mammogram for malignant neoplasm of breast: Secondary | ICD-10-CM | POA: Diagnosis not present

## 2023-10-07 DIAGNOSIS — Z01419 Encounter for gynecological examination (general) (routine) without abnormal findings: Secondary | ICD-10-CM | POA: Diagnosis not present

## 2023-11-11 DIAGNOSIS — K642 Third degree hemorrhoids: Secondary | ICD-10-CM | POA: Diagnosis not present

## 2023-11-11 DIAGNOSIS — K5909 Other constipation: Secondary | ICD-10-CM | POA: Diagnosis not present

## 2023-11-11 DIAGNOSIS — K644 Residual hemorrhoidal skin tags: Secondary | ICD-10-CM | POA: Diagnosis not present

## 2023-12-22 DIAGNOSIS — K080 Exfoliation of teeth due to systemic causes: Secondary | ICD-10-CM | POA: Diagnosis not present

## 2024-01-26 DIAGNOSIS — K080 Exfoliation of teeth due to systemic causes: Secondary | ICD-10-CM | POA: Diagnosis not present

## 2024-02-06 DIAGNOSIS — K219 Gastro-esophageal reflux disease without esophagitis: Secondary | ICD-10-CM | POA: Diagnosis not present

## 2024-02-06 DIAGNOSIS — Z0000 Encounter for general adult medical examination without abnormal findings: Secondary | ICD-10-CM | POA: Diagnosis not present

## 2024-02-06 DIAGNOSIS — E782 Mixed hyperlipidemia: Secondary | ICD-10-CM | POA: Diagnosis not present

## 2024-02-06 DIAGNOSIS — M810 Age-related osteoporosis without current pathological fracture: Secondary | ICD-10-CM | POA: Diagnosis not present

## 2024-02-10 ENCOUNTER — Other Ambulatory Visit (HOSPITAL_BASED_OUTPATIENT_CLINIC_OR_DEPARTMENT_OTHER): Payer: Self-pay | Admitting: Family Medicine

## 2024-02-10 DIAGNOSIS — L821 Other seborrheic keratosis: Secondary | ICD-10-CM | POA: Diagnosis not present

## 2024-02-10 DIAGNOSIS — E782 Mixed hyperlipidemia: Secondary | ICD-10-CM

## 2024-02-10 DIAGNOSIS — D485 Neoplasm of uncertain behavior of skin: Secondary | ICD-10-CM | POA: Diagnosis not present

## 2024-02-18 DIAGNOSIS — M810 Age-related osteoporosis without current pathological fracture: Secondary | ICD-10-CM | POA: Diagnosis not present

## 2024-02-18 DIAGNOSIS — E782 Mixed hyperlipidemia: Secondary | ICD-10-CM | POA: Diagnosis not present

## 2024-02-18 DIAGNOSIS — Z0000 Encounter for general adult medical examination without abnormal findings: Secondary | ICD-10-CM | POA: Diagnosis not present

## 2024-02-19 ENCOUNTER — Ambulatory Visit (HOSPITAL_BASED_OUTPATIENT_CLINIC_OR_DEPARTMENT_OTHER)
Admission: RE | Admit: 2024-02-19 | Discharge: 2024-02-19 | Disposition: A | Discharge status: Home / Self Care | Payer: Self-pay | Source: Ambulatory Visit | Attending: Family Medicine | Admitting: Family Medicine

## 2024-02-19 DIAGNOSIS — E782 Mixed hyperlipidemia: Secondary | ICD-10-CM | POA: Insufficient documentation

## 2024-04-15 DIAGNOSIS — Z85820 Personal history of malignant melanoma of skin: Secondary | ICD-10-CM | POA: Diagnosis not present

## 2024-04-15 DIAGNOSIS — D3611 Benign neoplasm of peripheral nerves and autonomic nervous system of face, head, and neck: Secondary | ICD-10-CM | POA: Diagnosis not present

## 2024-04-15 DIAGNOSIS — L821 Other seborrheic keratosis: Secondary | ICD-10-CM | POA: Diagnosis not present

## 2024-04-15 DIAGNOSIS — D225 Melanocytic nevi of trunk: Secondary | ICD-10-CM | POA: Diagnosis not present

## 2024-04-15 DIAGNOSIS — Z85828 Personal history of other malignant neoplasm of skin: Secondary | ICD-10-CM | POA: Diagnosis not present

## 2024-05-05 DIAGNOSIS — K080 Exfoliation of teeth due to systemic causes: Secondary | ICD-10-CM | POA: Diagnosis not present
